# Patient Record
Sex: Male | Born: 1935 | Race: White | Hispanic: No | Marital: Married | Smoking: Never smoker
Health system: Southern US, Community
[De-identification: ages and names within clinical notes are randomized; demographics above are authoritative.]

## PROBLEM LIST (undated history)

## (undated) DIAGNOSIS — I8393 Asymptomatic varicose veins of bilateral lower extremities: Secondary | ICD-10-CM

## (undated) DIAGNOSIS — H409 Unspecified glaucoma: Secondary | ICD-10-CM

## (undated) DIAGNOSIS — K5900 Constipation, unspecified: Secondary | ICD-10-CM

## (undated) DIAGNOSIS — M79676 Pain in unspecified toe(s): Secondary | ICD-10-CM

## (undated) DIAGNOSIS — E785 Hyperlipidemia, unspecified: Secondary | ICD-10-CM

## (undated) DIAGNOSIS — M549 Dorsalgia, unspecified: Secondary | ICD-10-CM

## (undated) DIAGNOSIS — Z87442 Personal history of urinary calculi: Secondary | ICD-10-CM

## (undated) DIAGNOSIS — M109 Gout, unspecified: Secondary | ICD-10-CM

## (undated) DIAGNOSIS — Z8719 Personal history of other diseases of the digestive system: Secondary | ICD-10-CM

## (undated) DIAGNOSIS — R972 Elevated prostate specific antigen [PSA]: Secondary | ICD-10-CM

## (undated) DIAGNOSIS — M1990 Unspecified osteoarthritis, unspecified site: Secondary | ICD-10-CM

## (undated) DIAGNOSIS — H9190 Unspecified hearing loss, unspecified ear: Secondary | ICD-10-CM

## (undated) DIAGNOSIS — R109 Unspecified abdominal pain: Secondary | ICD-10-CM

## (undated) DIAGNOSIS — G5770 Causalgia of unspecified lower limb: Secondary | ICD-10-CM

## (undated) DIAGNOSIS — Z87898 Personal history of other specified conditions: Secondary | ICD-10-CM

## (undated) DIAGNOSIS — N3289 Other specified disorders of bladder: Secondary | ICD-10-CM

## (undated) DIAGNOSIS — J40 Bronchitis, not specified as acute or chronic: Secondary | ICD-10-CM

## (undated) DIAGNOSIS — N400 Enlarged prostate without lower urinary tract symptoms: Secondary | ICD-10-CM

## (undated) DIAGNOSIS — M2550 Pain in unspecified joint: Secondary | ICD-10-CM

## (undated) DIAGNOSIS — R001 Bradycardia, unspecified: Secondary | ICD-10-CM

## (undated) DIAGNOSIS — G2581 Restless legs syndrome: Secondary | ICD-10-CM

## (undated) DIAGNOSIS — R5383 Other fatigue: Secondary | ICD-10-CM

## (undated) DIAGNOSIS — R1310 Dysphagia, unspecified: Secondary | ICD-10-CM

## (undated) DIAGNOSIS — K862 Cyst of pancreas: Secondary | ICD-10-CM

## (undated) DIAGNOSIS — R269 Unspecified abnormalities of gait and mobility: Secondary | ICD-10-CM

## (undated) DIAGNOSIS — H3530 Unspecified macular degeneration: Secondary | ICD-10-CM

## (undated) DIAGNOSIS — K219 Gastro-esophageal reflux disease without esophagitis: Secondary | ICD-10-CM

## (undated) DIAGNOSIS — R079 Chest pain, unspecified: Secondary | ICD-10-CM

## (undated) DIAGNOSIS — I951 Orthostatic hypotension: Secondary | ICD-10-CM

## (undated) DIAGNOSIS — W57XXXA Bitten or stung by nonvenomous insect and other nonvenomous arthropods, initial encounter: Secondary | ICD-10-CM

## (undated) HISTORY — DX: Dorsalgia, unspecified: M54.9

## (undated) HISTORY — DX: Pain in unspecified joint: M25.50

## (undated) HISTORY — DX: Other fatigue: R53.83

## (undated) HISTORY — PX: CHOLECYSTECTOMY: SHX55

## (undated) HISTORY — DX: Cyst of pancreas: K86.2

## (undated) HISTORY — DX: Personal history of other diseases of the digestive system: Z87.19

## (undated) HISTORY — DX: Bradycardia, unspecified: R00.1

## (undated) HISTORY — DX: Restless legs syndrome: G25.81

## (undated) HISTORY — DX: Hyperlipidemia, unspecified: E78.5

## (undated) HISTORY — DX: Elevated prostate specific antigen (PSA): R97.20

## (undated) HISTORY — DX: Other specified disorders of bladder: N32.89

## (undated) HISTORY — DX: Pain in unspecified toe(s): M79.676

## (undated) HISTORY — DX: Benign prostatic hyperplasia without lower urinary tract symptoms: N40.0

## (undated) HISTORY — DX: Bitten or stung by nonvenomous insect and other nonvenomous arthropods, initial encounter: W57.XXXA

## (undated) HISTORY — DX: Gout, unspecified: M10.9

## (undated) HISTORY — DX: Dysphagia, unspecified: R13.10

## (undated) HISTORY — DX: Bronchitis, not specified as acute or chronic: J40

## (undated) HISTORY — DX: Unspecified macular degeneration: H35.30

## (undated) HISTORY — DX: Unspecified osteoarthritis, unspecified site: M19.90

## (undated) HISTORY — DX: Unspecified abnormalities of gait and mobility: R26.9

## (undated) HISTORY — DX: Unspecified glaucoma: H40.9

## (undated) HISTORY — DX: Asymptomatic varicose veins of bilateral lower extremities: I83.93

## (undated) HISTORY — DX: Orthostatic hypotension: I95.1

## (undated) HISTORY — DX: Causalgia of unspecified lower limb: G57.70

## (undated) HISTORY — DX: Unspecified abdominal pain: R10.9

## (undated) HISTORY — DX: Chest pain, unspecified: R07.9

## (undated) HISTORY — DX: Personal history of other specified conditions: Z87.898

## (undated) HISTORY — DX: Gastro-esophageal reflux disease without esophagitis: K21.9

## (undated) HISTORY — DX: Constipation, unspecified: K59.00

---

## 2001-10-13 ENCOUNTER — Other Ambulatory Visit: Admission: RE | Admit: 2001-10-13 | Discharge: 2001-10-13 | Payer: Self-pay | Admitting: General Surgery

## 2013-10-07 ENCOUNTER — Emergency Department (HOSPITAL_COMMUNITY)
Admission: EM | Admit: 2013-10-07 | Discharge: 2013-10-07 | Disposition: A | Discharge status: Home / Self Care | Payer: Medicare HMO | Attending: Emergency Medicine | Admitting: Emergency Medicine

## 2013-10-07 ENCOUNTER — Emergency Department (HOSPITAL_COMMUNITY): Payer: Medicare HMO

## 2013-10-07 ENCOUNTER — Encounter (HOSPITAL_COMMUNITY): Payer: Self-pay | Admitting: Emergency Medicine

## 2013-10-07 DIAGNOSIS — Z880 Allergy status to penicillin: Secondary | ICD-10-CM | POA: Insufficient documentation

## 2013-10-07 DIAGNOSIS — R531 Weakness: Secondary | ICD-10-CM

## 2013-10-07 DIAGNOSIS — B370 Candidal stomatitis: Secondary | ICD-10-CM | POA: Insufficient documentation

## 2013-10-07 DIAGNOSIS — R5383 Other fatigue: Secondary | ICD-10-CM

## 2013-10-07 DIAGNOSIS — Z87442 Personal history of urinary calculi: Secondary | ICD-10-CM | POA: Insufficient documentation

## 2013-10-07 DIAGNOSIS — R5381 Other malaise: Secondary | ICD-10-CM | POA: Insufficient documentation

## 2013-10-07 LAB — URINE MICROSCOPIC-ADD ON

## 2013-10-07 LAB — URINALYSIS, ROUTINE W REFLEX MICROSCOPIC
BILIRUBIN URINE: NEGATIVE
Glucose, UA: NEGATIVE mg/dL
Ketones, ur: NEGATIVE mg/dL
Leukocytes, UA: NEGATIVE
NITRITE: NEGATIVE
PH: 6.5 (ref 5.0–8.0)
Protein, ur: NEGATIVE mg/dL
SPECIFIC GRAVITY, URINE: 1.01 (ref 1.005–1.030)
Urobilinogen, UA: 0.2 mg/dL (ref 0.0–1.0)

## 2013-10-07 LAB — COMPREHENSIVE METABOLIC PANEL
ALT: 14 U/L (ref 0–53)
AST: 23 U/L (ref 0–37)
Albumin: 3.8 g/dL (ref 3.5–5.2)
Alkaline Phosphatase: 71 U/L (ref 39–117)
BUN: 15 mg/dL (ref 6–23)
CALCIUM: 9.2 mg/dL (ref 8.4–10.5)
CO2: 25 meq/L (ref 19–32)
CREATININE: 1.32 mg/dL (ref 0.50–1.35)
Chloride: 96 mEq/L (ref 96–112)
GFR calc non Af Amer: 46 mL/min — ABNORMAL LOW (ref >90)
GFR, EST AFRICAN AMERICAN: 53 mL/min — AB (ref >90)
GLUCOSE: 105 mg/dL — AB (ref 70–99)
Potassium: 4.2 mEq/L (ref 3.7–5.3)
Sodium: 134 mEq/L — ABNORMAL LOW (ref 137–147)
TOTAL PROTEIN: 8.1 g/dL (ref 6.0–8.3)
Total Bilirubin: 0.7 mg/dL (ref 0.3–1.2)

## 2013-10-07 LAB — CBC WITH DIFFERENTIAL/PLATELET
Basophils Absolute: 0 10*3/uL (ref 0.0–0.1)
Basophils Relative: 0 % (ref 0–1)
EOS ABS: 0.1 10*3/uL (ref 0.0–0.7)
EOS PCT: 1 % (ref 0–5)
HEMATOCRIT: 36.9 % — AB (ref 39.0–52.0)
HEMOGLOBIN: 12.1 g/dL — AB (ref 13.0–17.0)
LYMPHS ABS: 1.1 10*3/uL (ref 0.7–4.0)
LYMPHS PCT: 13 % (ref 12–46)
MCH: 28.6 pg (ref 26.0–34.0)
MCHC: 32.8 g/dL (ref 30.0–36.0)
MCV: 87.2 fL (ref 78.0–100.0)
MONO ABS: 0.8 10*3/uL (ref 0.1–1.0)
MONOS PCT: 10 % (ref 3–12)
Neutro Abs: 6.5 10*3/uL (ref 1.7–7.7)
Neutrophils Relative %: 76 % (ref 43–77)
Platelets: 176 10*3/uL (ref 150–400)
RBC: 4.23 MIL/uL (ref 4.22–5.81)
RDW: 15.3 % (ref 11.5–15.5)
WBC: 8.5 10*3/uL (ref 4.0–10.5)

## 2013-10-07 LAB — PRO B NATRIURETIC PEPTIDE: PRO B NATRI PEPTIDE: 239.2 pg/mL (ref 0–450)

## 2013-10-07 MED ORDER — NYSTATIN 100000 UNIT/ML MT SUSP
5.0000 mL | Freq: Once | OROMUCOSAL | Status: AC
  Administered 2013-10-07: 500000 [IU] via ORAL
  Filled 2013-10-07: qty 5

## 2013-10-07 MED ORDER — SODIUM CHLORIDE 0.9 % IV SOLN
1000.0000 mL | Freq: Once | INTRAVENOUS | Status: AC
  Administered 2013-10-07: 1000 mL via INTRAVENOUS

## 2013-10-07 MED ORDER — SODIUM CHLORIDE 0.9 % IV SOLN: 1000.0000 mL | INTRAVENOUS | Status: DC

## 2013-10-07 MED ORDER — NYSTATIN 100000 UNIT/ML MT SUSP: 5.0000 mL | Freq: Four times a day (QID) | OROMUCOSAL | Status: AC

## 2013-10-07 MED ORDER — LIDOCAINE VISCOUS 2 % MT SOLN
15.0000 mL | Freq: Once | OROMUCOSAL | Status: AC
  Administered 2013-10-07: 15 mL via OROMUCOSAL
  Filled 2013-10-07: qty 15

## 2013-10-07 NOTE — Discharge Instructions (Signed)
As discussed your exam has been largely reassuring.  There is evidence of thrush, or a yeast infection in the back of your throat.  It is important to stay well hydrated, get plenty of rest, and to have a repeat exam with a primary care physician in the next 3-5 days.  If you develop new, or concerning changes in your condition, please be sure to return here immediately.

## 2013-10-07 NOTE — ED Notes (Signed)
Patient from home complaining of weakness, cough, and fever starting today.

## 2013-10-07 NOTE — ED Provider Notes (Signed)
CSN: [id number]     Arrival date & time 10/07/13  2010 History  This chart was scribed for Carmin Muskrat, MD by Rolanda Lundborg, ED Scribe. This patient was seen in room APA09/APA09 and the patient's care was started at 8:20 PM.   Chief Complaint  Patient presents with  . Weakness  . Cough  . Fever   The history is provided by the patient. No language interpreter was used.   HPI Comments: Jonathon Butler is a 80 y.o. male who presents to the Emergency Department complaining of generalized weakness since yesterday with worsening cough and sore throat. He took Mucinex today with some relief. He reports lately he has been feeling dizzy after he gets out of the shower in the mornings. He denies CP, abdominal pain, nausea, vomiting.   Past Medical History  Diagnosis Date  . Kidney stones    Past Surgical History  Procedure Laterality Date  . Cholecystectomy     History reviewed. No pertinent family history. History  Substance Use Topics  . Smoking status: Never Smoker   . Smokeless tobacco: Not on file  . Alcohol Use: No    Review of Systems  Constitutional:       Per HPI, otherwise negative  HENT:       Per HPI, otherwise negative  Respiratory:       Per HPI, otherwise negative  Cardiovascular:       Per HPI, otherwise negative  Gastrointestinal: Negative for vomiting.  Endocrine:       Negative aside from HPI  Genitourinary:       Neg aside from HPI   Musculoskeletal:       Per HPI, otherwise negative  Skin: Negative.   Neurological: Negative for syncope.    Allergies  Penicillins  Home Medications  No current outpatient prescriptions on file. BP 165/71  Pulse 87  Temp(Src) 98.1 F (36.7 C) (Oral)  Resp 20  Ht 6\' 2"  (1.88 m)  Wt 202 lb (91.627 kg)  BMI 25.92 kg/m2  SpO2 95% Physical Exam  Nursing note and vitals reviewed. Constitutional: He is oriented to person, place, and time. He appears well-developed. No distress.  HENT:  Head: Normocephalic  and atraumatic.  Mucous membranes dry  Eyes: Conjunctivae and EOM are normal.  Cardiovascular: Normal rate and regular rhythm.   Pulmonary/Chest: Effort normal. No stridor. No respiratory distress.  Abdominal: He exhibits no distension.  Musculoskeletal: He exhibits no edema.  Neurological: He is alert and oriented to person, place, and time.  Skin: Skin is warm and dry.  Psychiatric: He has a normal mood and affect.    ED Course  Procedures (including critical care time) Medications  0.9 %  sodium chloride infusion (1,000 mLs Intravenous New Bag/Given 10/07/13 2115)    Followed by  0.9 %  sodium chloride infusion (not administered)  lidocaine (XYLOCAINE) 2 % viscous mouth solution 15 mL (15 mLs Mouth/Throat Given 10/07/13 2115)    COORDINATION OF CARE: 8:30 PM- Discussed treatment plan with pt. Pt agrees to plan.    Labs Review Labs Reviewed  CBC WITH DIFFERENTIAL - Abnormal; Notable for the following:    Hemoglobin 12.1 (*)    HCT 36.9 (*)    All other components within normal limits  URINALYSIS, ROUTINE W REFLEX MICROSCOPIC - Abnormal; Notable for the following:    Hgb urine dipstick TRACE (*)    All other components within normal limits  CULTURE, BLOOD (ROUTINE X 2)  CULTURE, BLOOD (ROUTINE X  2)  URINE MICROSCOPIC-ADD ON  COMPREHENSIVE METABOLIC PANEL  LACTIC ACID, PLASMA   Imaging Review Dg Chest Port 1 View  10/07/2013   CLINICAL DATA:  Weakness, cough, fever  EXAM: PORTABLE CHEST - 1 VIEW  COMPARISON:  None.  FINDINGS: Cardiomegaly with vascular congestion. No definite CHF or pneumonia. No collapse or consolidation. Negative for effusion or pneumothorax. Trachea midline. Atherosclerosis of the aorta. Degenerative changes of spine.  IMPRESSION: Cardiomegaly and vascular congestion   Electronically Signed   By: Daryll Brod M.D.   On: 10/07/2013 21:28    EKG Interpretation   None      On repeat exam, and after discussing all results with patient and his family  members, patient continues to have mild discomfort in the back of his throat.  Repeat visualization demonstrates a whitish plaque in the posterior oropharynx, with no asymmetry, no edema. MDM  This previously well elderly male with no known medical issues now presents with generalized discomfort, cough, subjective fever.  In addition the patient has oral pharyngeal discomfort.  Patient is in no respiratory distress, has stable vital signs, is afebrile, and his labs are largely reassuring.  There is some evidence of thrush for which the patient received topical medication.  Absent distress, warm and findings, he was discharged in stable condition to follow up with any primary care physician.   Carmin Muskrat, MD 10/07/13 2325

## 2013-10-08 LAB — LACTIC ACID, PLASMA: Lactic Acid, Venous: 1.8 mmol/L (ref 0.5–2.2)

## 2013-10-12 LAB — CULTURE, BLOOD (ROUTINE X 2)
Culture: NO GROWTH
Culture: NO GROWTH

## 2014-12-15 ENCOUNTER — Encounter (INDEPENDENT_AMBULATORY_CARE_PROVIDER_SITE_OTHER): Payer: Self-pay | Admitting: Ophthalmology

## 2015-01-10 ENCOUNTER — Encounter (INDEPENDENT_AMBULATORY_CARE_PROVIDER_SITE_OTHER): Payer: Self-pay | Admitting: Ophthalmology

## 2016-01-26 IMAGING — CR DG CHEST 1V PORT
1 series · 1 of 1 positions shown · non-contrast
Comparison: None.

CLINICAL DATA: Weakness, cough, fever

EXAM:
PORTABLE CHEST - 1 VIEW

[portable]
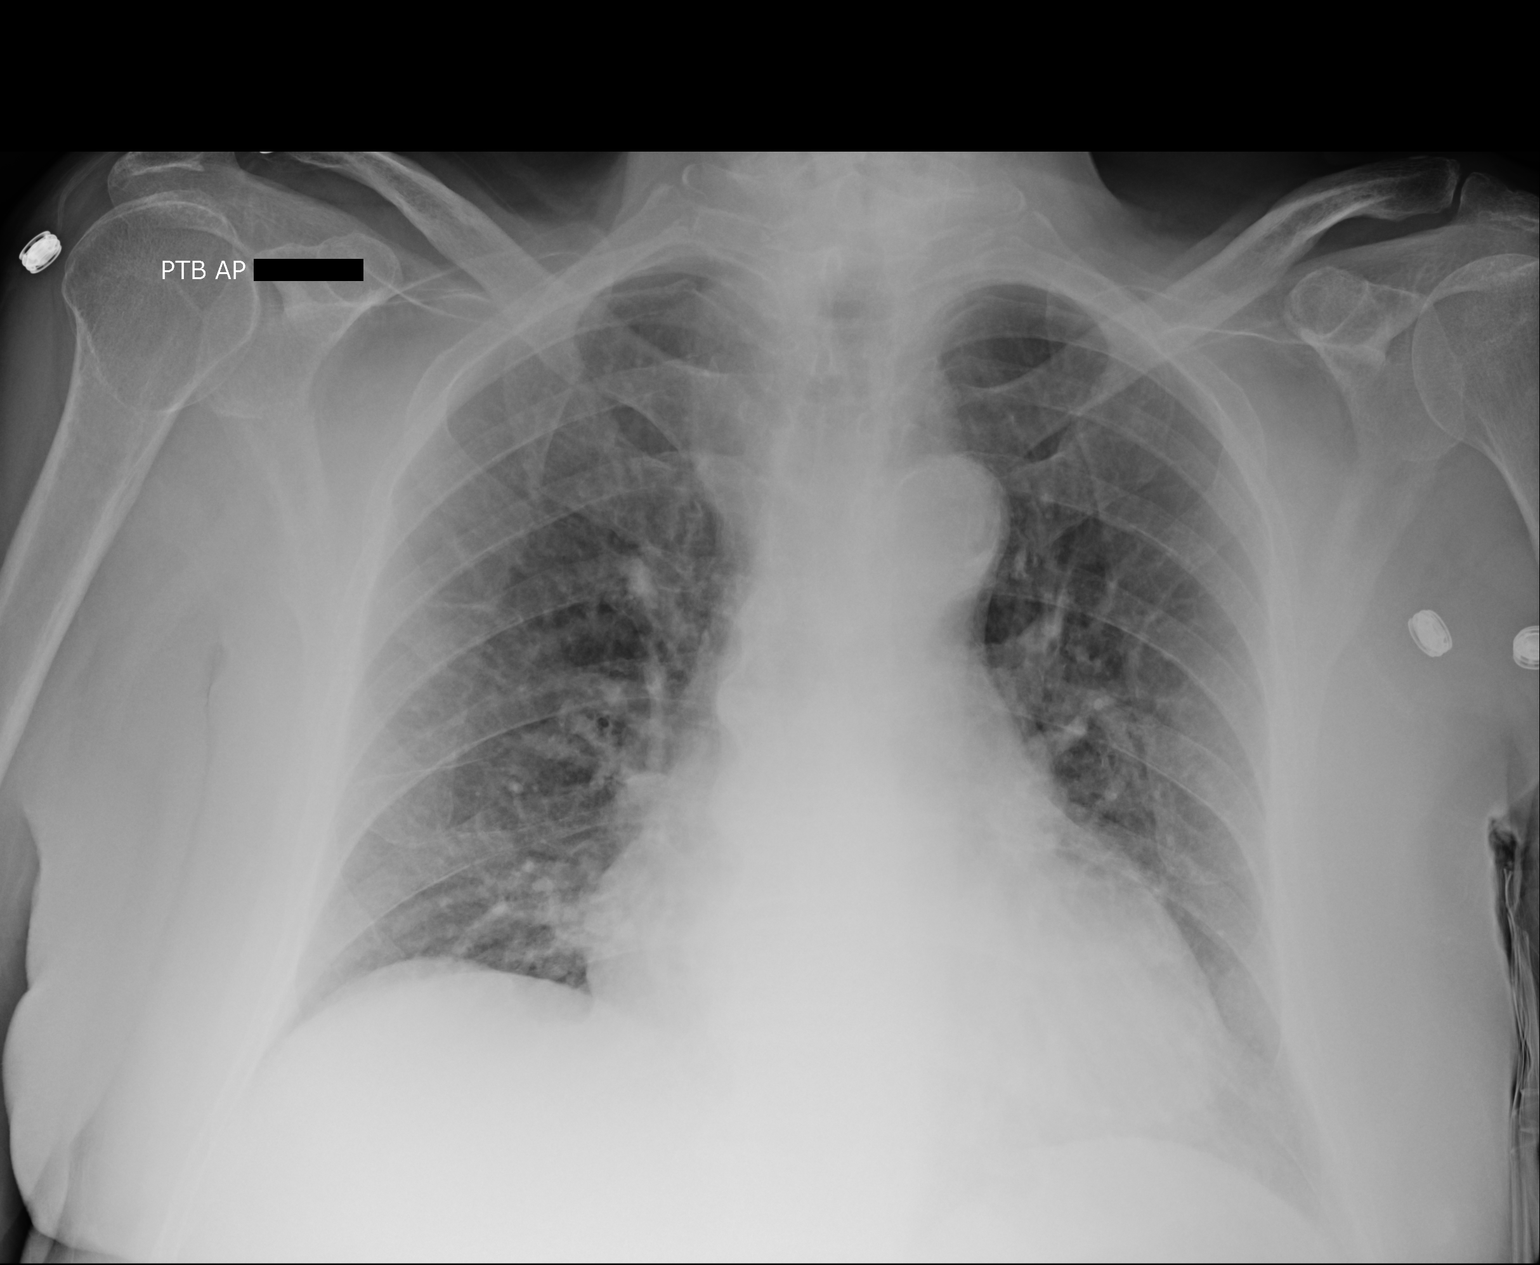

[1 of 1 positions shown; findings below may reference images not displayed]

FINDINGS: Cardiomegaly with vascular congestion. No definite CHF or pneumonia.
No collapse or consolidation. Negative for effusion or pneumothorax.
Trachea midline. Atherosclerosis of the aorta. Degenerative changes
of spine.
IMPRESSION: Cardiomegaly and vascular congestion

## 2016-10-25 DIAGNOSIS — H401133 Primary open-angle glaucoma, bilateral, severe stage: Secondary | ICD-10-CM | POA: Diagnosis not present

## 2016-11-11 DIAGNOSIS — Z859 Personal history of malignant neoplasm, unspecified: Secondary | ICD-10-CM | POA: Diagnosis not present

## 2016-11-11 DIAGNOSIS — K219 Gastro-esophageal reflux disease without esophagitis: Secondary | ICD-10-CM | POA: Diagnosis not present

## 2016-11-11 DIAGNOSIS — Z7982 Long term (current) use of aspirin: Secondary | ICD-10-CM | POA: Diagnosis not present

## 2016-11-11 DIAGNOSIS — Z87891 Personal history of nicotine dependence: Secondary | ICD-10-CM | POA: Diagnosis not present

## 2016-11-11 DIAGNOSIS — H409 Unspecified glaucoma: Secondary | ICD-10-CM | POA: Diagnosis not present

## 2016-11-11 DIAGNOSIS — N401 Enlarged prostate with lower urinary tract symptoms: Secondary | ICD-10-CM | POA: Diagnosis not present

## 2016-11-11 DIAGNOSIS — Z6828 Body mass index (BMI) 28.0-28.9, adult: Secondary | ICD-10-CM | POA: Diagnosis not present

## 2016-11-11 DIAGNOSIS — Z0000 Encounter for general adult medical examination without abnormal findings: Secondary | ICD-10-CM | POA: Diagnosis not present

## 2016-11-11 DIAGNOSIS — Z79899 Other long term (current) drug therapy: Secondary | ICD-10-CM | POA: Diagnosis not present

## 2016-12-04 DIAGNOSIS — H401122 Primary open-angle glaucoma, left eye, moderate stage: Secondary | ICD-10-CM | POA: Diagnosis not present

## 2016-12-04 DIAGNOSIS — H401113 Primary open-angle glaucoma, right eye, severe stage: Secondary | ICD-10-CM | POA: Diagnosis not present

## 2016-12-04 DIAGNOSIS — H353230 Exudative age-related macular degeneration, bilateral, stage unspecified: Secondary | ICD-10-CM | POA: Diagnosis not present

## 2016-12-24 ENCOUNTER — Encounter (INDEPENDENT_AMBULATORY_CARE_PROVIDER_SITE_OTHER): Payer: Medicare HMO | Admitting: Ophthalmology

## 2016-12-24 DIAGNOSIS — H43813 Vitreous degeneration, bilateral: Secondary | ICD-10-CM

## 2016-12-24 DIAGNOSIS — H353231 Exudative age-related macular degeneration, bilateral, with active choroidal neovascularization: Secondary | ICD-10-CM

## 2016-12-24 DIAGNOSIS — H26492 Other secondary cataract, left eye: Secondary | ICD-10-CM

## 2016-12-24 DIAGNOSIS — H47231 Glaucomatous optic atrophy, right eye: Secondary | ICD-10-CM

## 2017-01-06 DIAGNOSIS — N182 Chronic kidney disease, stage 2 (mild): Secondary | ICD-10-CM | POA: Diagnosis not present

## 2017-01-06 DIAGNOSIS — K219 Gastro-esophageal reflux disease without esophagitis: Secondary | ICD-10-CM | POA: Diagnosis not present

## 2017-01-06 DIAGNOSIS — Z713 Dietary counseling and surveillance: Secondary | ICD-10-CM | POA: Diagnosis not present

## 2017-01-06 DIAGNOSIS — N400 Enlarged prostate without lower urinary tract symptoms: Secondary | ICD-10-CM | POA: Diagnosis not present

## 2017-01-06 DIAGNOSIS — Z6829 Body mass index (BMI) 29.0-29.9, adult: Secondary | ICD-10-CM | POA: Diagnosis not present

## 2017-01-06 DIAGNOSIS — H3530 Unspecified macular degeneration: Secondary | ICD-10-CM | POA: Diagnosis not present

## 2017-01-06 DIAGNOSIS — Z299 Encounter for prophylactic measures, unspecified: Secondary | ICD-10-CM | POA: Diagnosis not present

## 2017-01-21 ENCOUNTER — Encounter (INDEPENDENT_AMBULATORY_CARE_PROVIDER_SITE_OTHER): Payer: Medicare HMO | Admitting: Ophthalmology

## 2017-01-21 DIAGNOSIS — H43813 Vitreous degeneration, bilateral: Secondary | ICD-10-CM

## 2017-01-21 DIAGNOSIS — H2703 Aphakia, bilateral: Secondary | ICD-10-CM

## 2017-01-21 DIAGNOSIS — H353231 Exudative age-related macular degeneration, bilateral, with active choroidal neovascularization: Secondary | ICD-10-CM

## 2017-01-21 DIAGNOSIS — H26492 Other secondary cataract, left eye: Secondary | ICD-10-CM

## 2017-02-19 ENCOUNTER — Encounter (INDEPENDENT_AMBULATORY_CARE_PROVIDER_SITE_OTHER): Payer: Medicare HMO | Admitting: Ophthalmology

## 2017-02-19 DIAGNOSIS — H353231 Exudative age-related macular degeneration, bilateral, with active choroidal neovascularization: Secondary | ICD-10-CM | POA: Diagnosis not present

## 2017-02-19 DIAGNOSIS — H43813 Vitreous degeneration, bilateral: Secondary | ICD-10-CM

## 2017-02-19 DIAGNOSIS — H47231 Glaucomatous optic atrophy, right eye: Secondary | ICD-10-CM | POA: Diagnosis not present

## 2017-03-07 DIAGNOSIS — K219 Gastro-esophageal reflux disease without esophagitis: Secondary | ICD-10-CM | POA: Diagnosis not present

## 2017-03-07 DIAGNOSIS — Z713 Dietary counseling and surveillance: Secondary | ICD-10-CM | POA: Diagnosis not present

## 2017-03-07 DIAGNOSIS — I8393 Asymptomatic varicose veins of bilateral lower extremities: Secondary | ICD-10-CM | POA: Diagnosis not present

## 2017-03-07 DIAGNOSIS — N400 Enlarged prostate without lower urinary tract symptoms: Secondary | ICD-10-CM | POA: Diagnosis not present

## 2017-03-07 DIAGNOSIS — R972 Elevated prostate specific antigen [PSA]: Secondary | ICD-10-CM | POA: Diagnosis not present

## 2017-03-07 DIAGNOSIS — Z6829 Body mass index (BMI) 29.0-29.9, adult: Secondary | ICD-10-CM | POA: Diagnosis not present

## 2017-03-07 DIAGNOSIS — Z299 Encounter for prophylactic measures, unspecified: Secondary | ICD-10-CM | POA: Diagnosis not present

## 2017-03-07 DIAGNOSIS — E7800 Pure hypercholesterolemia, unspecified: Secondary | ICD-10-CM | POA: Diagnosis not present

## 2017-03-19 ENCOUNTER — Encounter (INDEPENDENT_AMBULATORY_CARE_PROVIDER_SITE_OTHER): Payer: Medicare HMO | Admitting: Ophthalmology

## 2017-03-19 DIAGNOSIS — H43813 Vitreous degeneration, bilateral: Secondary | ICD-10-CM | POA: Diagnosis not present

## 2017-03-19 DIAGNOSIS — H353231 Exudative age-related macular degeneration, bilateral, with active choroidal neovascularization: Secondary | ICD-10-CM | POA: Diagnosis not present

## 2017-04-17 ENCOUNTER — Encounter (INDEPENDENT_AMBULATORY_CARE_PROVIDER_SITE_OTHER): Payer: Medicare HMO | Admitting: Ophthalmology

## 2017-04-17 DIAGNOSIS — H353231 Exudative age-related macular degeneration, bilateral, with active choroidal neovascularization: Secondary | ICD-10-CM

## 2017-04-17 DIAGNOSIS — H47231 Glaucomatous optic atrophy, right eye: Secondary | ICD-10-CM

## 2017-04-17 DIAGNOSIS — H43813 Vitreous degeneration, bilateral: Secondary | ICD-10-CM

## 2017-05-07 DIAGNOSIS — E7800 Pure hypercholesterolemia, unspecified: Secondary | ICD-10-CM | POA: Diagnosis not present

## 2017-05-07 DIAGNOSIS — Z299 Encounter for prophylactic measures, unspecified: Secondary | ICD-10-CM | POA: Diagnosis not present

## 2017-05-07 DIAGNOSIS — Z6829 Body mass index (BMI) 29.0-29.9, adult: Secondary | ICD-10-CM | POA: Diagnosis not present

## 2017-05-07 DIAGNOSIS — H353290 Exudative age-related macular degeneration, unspecified eye, stage unspecified: Secondary | ICD-10-CM | POA: Diagnosis not present

## 2017-05-07 DIAGNOSIS — Z7189 Other specified counseling: Secondary | ICD-10-CM | POA: Diagnosis not present

## 2017-05-07 DIAGNOSIS — H3530 Unspecified macular degeneration: Secondary | ICD-10-CM | POA: Diagnosis not present

## 2017-05-07 DIAGNOSIS — Z1389 Encounter for screening for other disorder: Secondary | ICD-10-CM | POA: Diagnosis not present

## 2017-05-07 DIAGNOSIS — R5383 Other fatigue: Secondary | ICD-10-CM | POA: Diagnosis not present

## 2017-05-07 DIAGNOSIS — Z0000 Encounter for general adult medical examination without abnormal findings: Secondary | ICD-10-CM | POA: Diagnosis not present

## 2017-05-07 DIAGNOSIS — M79675 Pain in left toe(s): Secondary | ICD-10-CM | POA: Diagnosis not present

## 2017-05-08 ENCOUNTER — Encounter: Payer: Self-pay | Admitting: Cardiovascular Disease

## 2017-05-08 DIAGNOSIS — R5383 Other fatigue: Secondary | ICD-10-CM | POA: Diagnosis not present

## 2017-05-08 DIAGNOSIS — E7800 Pure hypercholesterolemia, unspecified: Secondary | ICD-10-CM | POA: Diagnosis not present

## 2017-05-08 DIAGNOSIS — Z125 Encounter for screening for malignant neoplasm of prostate: Secondary | ICD-10-CM | POA: Diagnosis not present

## 2017-05-08 DIAGNOSIS — Z79899 Other long term (current) drug therapy: Secondary | ICD-10-CM | POA: Diagnosis not present

## 2017-05-08 DIAGNOSIS — Z0000 Encounter for general adult medical examination without abnormal findings: Secondary | ICD-10-CM | POA: Diagnosis not present

## 2017-05-14 DIAGNOSIS — H401113 Primary open-angle glaucoma, right eye, severe stage: Secondary | ICD-10-CM | POA: Diagnosis not present

## 2017-05-14 DIAGNOSIS — H401122 Primary open-angle glaucoma, left eye, moderate stage: Secondary | ICD-10-CM | POA: Diagnosis not present

## 2017-05-29 ENCOUNTER — Encounter (INDEPENDENT_AMBULATORY_CARE_PROVIDER_SITE_OTHER): Payer: Medicare HMO | Admitting: Ophthalmology

## 2017-05-29 DIAGNOSIS — H35033 Hypertensive retinopathy, bilateral: Secondary | ICD-10-CM

## 2017-05-29 DIAGNOSIS — H353231 Exudative age-related macular degeneration, bilateral, with active choroidal neovascularization: Secondary | ICD-10-CM | POA: Diagnosis not present

## 2017-05-29 DIAGNOSIS — I10 Essential (primary) hypertension: Secondary | ICD-10-CM | POA: Diagnosis not present

## 2017-05-29 DIAGNOSIS — H43813 Vitreous degeneration, bilateral: Secondary | ICD-10-CM | POA: Diagnosis not present

## 2017-06-06 DIAGNOSIS — Z299 Encounter for prophylactic measures, unspecified: Secondary | ICD-10-CM | POA: Diagnosis not present

## 2017-06-06 DIAGNOSIS — I8393 Asymptomatic varicose veins of bilateral lower extremities: Secondary | ICD-10-CM | POA: Diagnosis not present

## 2017-06-06 DIAGNOSIS — Z6828 Body mass index (BMI) 28.0-28.9, adult: Secondary | ICD-10-CM | POA: Diagnosis not present

## 2017-06-06 DIAGNOSIS — H353290 Exudative age-related macular degeneration, unspecified eye, stage unspecified: Secondary | ICD-10-CM | POA: Diagnosis not present

## 2017-06-06 DIAGNOSIS — N400 Enlarged prostate without lower urinary tract symptoms: Secondary | ICD-10-CM | POA: Diagnosis not present

## 2017-06-06 DIAGNOSIS — E7800 Pure hypercholesterolemia, unspecified: Secondary | ICD-10-CM | POA: Diagnosis not present

## 2017-06-06 DIAGNOSIS — R972 Elevated prostate specific antigen [PSA]: Secondary | ICD-10-CM | POA: Diagnosis not present

## 2017-06-10 DIAGNOSIS — K219 Gastro-esophageal reflux disease without esophagitis: Secondary | ICD-10-CM | POA: Diagnosis not present

## 2017-06-10 DIAGNOSIS — S91311A Laceration without foreign body, right foot, initial encounter: Secondary | ICD-10-CM | POA: Diagnosis not present

## 2017-06-10 DIAGNOSIS — R269 Unspecified abnormalities of gait and mobility: Secondary | ICD-10-CM | POA: Diagnosis not present

## 2017-06-10 DIAGNOSIS — Z299 Encounter for prophylactic measures, unspecified: Secondary | ICD-10-CM | POA: Diagnosis not present

## 2017-06-10 DIAGNOSIS — W25XXXA Contact with sharp glass, initial encounter: Secondary | ICD-10-CM | POA: Diagnosis not present

## 2017-06-10 DIAGNOSIS — Z6828 Body mass index (BMI) 28.0-28.9, adult: Secondary | ICD-10-CM | POA: Diagnosis not present

## 2017-06-10 DIAGNOSIS — E7800 Pure hypercholesterolemia, unspecified: Secondary | ICD-10-CM | POA: Diagnosis not present

## 2017-07-24 ENCOUNTER — Encounter (INDEPENDENT_AMBULATORY_CARE_PROVIDER_SITE_OTHER): Payer: Medicare HMO | Admitting: Ophthalmology

## 2017-08-04 ENCOUNTER — Encounter (INDEPENDENT_AMBULATORY_CARE_PROVIDER_SITE_OTHER): Payer: Medicare HMO | Admitting: Ophthalmology

## 2017-08-04 DIAGNOSIS — H353231 Exudative age-related macular degeneration, bilateral, with active choroidal neovascularization: Secondary | ICD-10-CM

## 2017-08-04 DIAGNOSIS — H43813 Vitreous degeneration, bilateral: Secondary | ICD-10-CM

## 2017-09-12 DIAGNOSIS — E7800 Pure hypercholesterolemia, unspecified: Secondary | ICD-10-CM | POA: Diagnosis not present

## 2017-09-12 DIAGNOSIS — Z789 Other specified health status: Secondary | ICD-10-CM | POA: Diagnosis not present

## 2017-09-12 DIAGNOSIS — Z6829 Body mass index (BMI) 29.0-29.9, adult: Secondary | ICD-10-CM | POA: Diagnosis not present

## 2017-09-12 DIAGNOSIS — H353290 Exudative age-related macular degeneration, unspecified eye, stage unspecified: Secondary | ICD-10-CM | POA: Diagnosis not present

## 2017-09-12 DIAGNOSIS — N400 Enlarged prostate without lower urinary tract symptoms: Secondary | ICD-10-CM | POA: Diagnosis not present

## 2017-09-12 DIAGNOSIS — Z299 Encounter for prophylactic measures, unspecified: Secondary | ICD-10-CM | POA: Diagnosis not present

## 2017-09-16 DIAGNOSIS — H16223 Keratoconjunctivitis sicca, not specified as Sjogren's, bilateral: Secondary | ICD-10-CM | POA: Diagnosis not present

## 2017-09-16 DIAGNOSIS — H401113 Primary open-angle glaucoma, right eye, severe stage: Secondary | ICD-10-CM | POA: Diagnosis not present

## 2017-09-16 DIAGNOSIS — H353230 Exudative age-related macular degeneration, bilateral, stage unspecified: Secondary | ICD-10-CM | POA: Diagnosis not present

## 2017-09-29 ENCOUNTER — Encounter (INDEPENDENT_AMBULATORY_CARE_PROVIDER_SITE_OTHER): Payer: Medicare HMO | Admitting: Ophthalmology

## 2017-09-29 DIAGNOSIS — H43813 Vitreous degeneration, bilateral: Secondary | ICD-10-CM | POA: Diagnosis not present

## 2017-09-29 DIAGNOSIS — H353232 Exudative age-related macular degeneration, bilateral, with inactive choroidal neovascularization: Secondary | ICD-10-CM | POA: Diagnosis not present

## 2017-10-28 ENCOUNTER — Ambulatory Visit: Payer: Medicare HMO | Admitting: Urology

## 2017-11-11 DIAGNOSIS — Z713 Dietary counseling and surveillance: Secondary | ICD-10-CM | POA: Diagnosis not present

## 2017-11-11 DIAGNOSIS — Z789 Other specified health status: Secondary | ICD-10-CM | POA: Diagnosis not present

## 2017-11-11 DIAGNOSIS — Z299 Encounter for prophylactic measures, unspecified: Secondary | ICD-10-CM | POA: Diagnosis not present

## 2017-11-11 DIAGNOSIS — Z6829 Body mass index (BMI) 29.0-29.9, adult: Secondary | ICD-10-CM | POA: Diagnosis not present

## 2017-11-11 DIAGNOSIS — M109 Gout, unspecified: Secondary | ICD-10-CM | POA: Diagnosis not present

## 2017-11-19 DIAGNOSIS — Z880 Allergy status to penicillin: Secondary | ICD-10-CM | POA: Diagnosis not present

## 2017-11-19 DIAGNOSIS — N400 Enlarged prostate without lower urinary tract symptoms: Secondary | ICD-10-CM | POA: Diagnosis not present

## 2017-11-19 DIAGNOSIS — Z7982 Long term (current) use of aspirin: Secondary | ICD-10-CM | POA: Diagnosis not present

## 2017-11-19 DIAGNOSIS — H409 Unspecified glaucoma: Secondary | ICD-10-CM | POA: Diagnosis not present

## 2017-11-19 DIAGNOSIS — K219 Gastro-esophageal reflux disease without esophagitis: Secondary | ICD-10-CM | POA: Diagnosis not present

## 2017-11-19 DIAGNOSIS — Z809 Family history of malignant neoplasm, unspecified: Secondary | ICD-10-CM | POA: Diagnosis not present

## 2017-11-19 DIAGNOSIS — Z833 Family history of diabetes mellitus: Secondary | ICD-10-CM | POA: Diagnosis not present

## 2017-11-24 ENCOUNTER — Encounter (INDEPENDENT_AMBULATORY_CARE_PROVIDER_SITE_OTHER): Payer: Medicare HMO | Admitting: Ophthalmology

## 2017-11-24 DIAGNOSIS — H43813 Vitreous degeneration, bilateral: Secondary | ICD-10-CM | POA: Diagnosis not present

## 2017-11-24 DIAGNOSIS — H353231 Exudative age-related macular degeneration, bilateral, with active choroidal neovascularization: Secondary | ICD-10-CM

## 2017-12-16 DIAGNOSIS — H401113 Primary open-angle glaucoma, right eye, severe stage: Secondary | ICD-10-CM | POA: Diagnosis not present

## 2017-12-16 DIAGNOSIS — H353230 Exudative age-related macular degeneration, bilateral, stage unspecified: Secondary | ICD-10-CM | POA: Diagnosis not present

## 2017-12-22 DIAGNOSIS — E7800 Pure hypercholesterolemia, unspecified: Secondary | ICD-10-CM | POA: Diagnosis not present

## 2017-12-22 DIAGNOSIS — Z299 Encounter for prophylactic measures, unspecified: Secondary | ICD-10-CM | POA: Diagnosis not present

## 2017-12-22 DIAGNOSIS — Z6828 Body mass index (BMI) 28.0-28.9, adult: Secondary | ICD-10-CM | POA: Diagnosis not present

## 2017-12-22 DIAGNOSIS — M25519 Pain in unspecified shoulder: Secondary | ICD-10-CM | POA: Diagnosis not present

## 2017-12-22 DIAGNOSIS — K219 Gastro-esophageal reflux disease without esophagitis: Secondary | ICD-10-CM | POA: Diagnosis not present

## 2018-01-09 ENCOUNTER — Encounter (INDEPENDENT_AMBULATORY_CARE_PROVIDER_SITE_OTHER): Payer: Medicare HMO | Admitting: Ophthalmology

## 2018-01-09 DIAGNOSIS — H43813 Vitreous degeneration, bilateral: Secondary | ICD-10-CM

## 2018-01-09 DIAGNOSIS — H353231 Exudative age-related macular degeneration, bilateral, with active choroidal neovascularization: Secondary | ICD-10-CM | POA: Diagnosis not present

## 2018-01-12 ENCOUNTER — Encounter (INDEPENDENT_AMBULATORY_CARE_PROVIDER_SITE_OTHER): Payer: Medicare HMO | Admitting: Ophthalmology

## 2018-01-13 DIAGNOSIS — Z299 Encounter for prophylactic measures, unspecified: Secondary | ICD-10-CM | POA: Diagnosis not present

## 2018-01-13 DIAGNOSIS — K219 Gastro-esophageal reflux disease without esophagitis: Secondary | ICD-10-CM | POA: Diagnosis not present

## 2018-01-13 DIAGNOSIS — Z6828 Body mass index (BMI) 28.0-28.9, adult: Secondary | ICD-10-CM | POA: Diagnosis not present

## 2018-01-13 DIAGNOSIS — Z713 Dietary counseling and surveillance: Secondary | ICD-10-CM | POA: Diagnosis not present

## 2018-01-20 DIAGNOSIS — Z880 Allergy status to penicillin: Secondary | ICD-10-CM | POA: Diagnosis not present

## 2018-01-20 DIAGNOSIS — J449 Chronic obstructive pulmonary disease, unspecified: Secondary | ICD-10-CM | POA: Diagnosis not present

## 2018-01-20 DIAGNOSIS — N400 Enlarged prostate without lower urinary tract symptoms: Secondary | ICD-10-CM | POA: Diagnosis not present

## 2018-01-20 DIAGNOSIS — R42 Dizziness and giddiness: Secondary | ICD-10-CM | POA: Diagnosis not present

## 2018-01-20 DIAGNOSIS — G459 Transient cerebral ischemic attack, unspecified: Secondary | ICD-10-CM | POA: Diagnosis not present

## 2018-01-20 DIAGNOSIS — R531 Weakness: Secondary | ICD-10-CM | POA: Diagnosis not present

## 2018-01-20 DIAGNOSIS — Z8673 Personal history of transient ischemic attack (TIA), and cerebral infarction without residual deficits: Secondary | ICD-10-CM | POA: Diagnosis not present

## 2018-01-20 DIAGNOSIS — R001 Bradycardia, unspecified: Secondary | ICD-10-CM | POA: Diagnosis not present

## 2018-01-20 DIAGNOSIS — I639 Cerebral infarction, unspecified: Secondary | ICD-10-CM | POA: Diagnosis not present

## 2018-01-20 DIAGNOSIS — Z79899 Other long term (current) drug therapy: Secondary | ICD-10-CM | POA: Diagnosis not present

## 2018-01-20 DIAGNOSIS — M6281 Muscle weakness (generalized): Secondary | ICD-10-CM | POA: Diagnosis not present

## 2018-01-21 DIAGNOSIS — R531 Weakness: Secondary | ICD-10-CM | POA: Diagnosis not present

## 2018-01-21 DIAGNOSIS — R001 Bradycardia, unspecified: Secondary | ICD-10-CM | POA: Diagnosis not present

## 2018-01-21 DIAGNOSIS — J449 Chronic obstructive pulmonary disease, unspecified: Secondary | ICD-10-CM | POA: Diagnosis not present

## 2018-01-21 DIAGNOSIS — G459 Transient cerebral ischemic attack, unspecified: Secondary | ICD-10-CM | POA: Diagnosis not present

## 2018-01-22 DIAGNOSIS — R001 Bradycardia, unspecified: Secondary | ICD-10-CM | POA: Diagnosis not present

## 2018-01-22 DIAGNOSIS — J449 Chronic obstructive pulmonary disease, unspecified: Secondary | ICD-10-CM | POA: Diagnosis not present

## 2018-01-22 DIAGNOSIS — G459 Transient cerebral ischemic attack, unspecified: Secondary | ICD-10-CM | POA: Diagnosis not present

## 2018-01-22 DIAGNOSIS — R531 Weakness: Secondary | ICD-10-CM | POA: Diagnosis not present

## 2018-01-22 DIAGNOSIS — M6281 Muscle weakness (generalized): Secondary | ICD-10-CM | POA: Diagnosis not present

## 2018-01-23 DIAGNOSIS — E7800 Pure hypercholesterolemia, unspecified: Secondary | ICD-10-CM | POA: Diagnosis not present

## 2018-01-23 DIAGNOSIS — I10 Essential (primary) hypertension: Secondary | ICD-10-CM | POA: Diagnosis not present

## 2018-01-23 DIAGNOSIS — I69354 Hemiplegia and hemiparesis following cerebral infarction affecting left non-dominant side: Secondary | ICD-10-CM | POA: Diagnosis not present

## 2018-01-23 DIAGNOSIS — J441 Chronic obstructive pulmonary disease with (acute) exacerbation: Secondary | ICD-10-CM | POA: Diagnosis not present

## 2018-01-23 DIAGNOSIS — N400 Enlarged prostate without lower urinary tract symptoms: Secondary | ICD-10-CM | POA: Diagnosis not present

## 2018-01-23 DIAGNOSIS — M1991 Primary osteoarthritis, unspecified site: Secondary | ICD-10-CM | POA: Diagnosis not present

## 2018-01-23 DIAGNOSIS — Z7982 Long term (current) use of aspirin: Secondary | ICD-10-CM | POA: Diagnosis not present

## 2018-01-23 DIAGNOSIS — J209 Acute bronchitis, unspecified: Secondary | ICD-10-CM | POA: Diagnosis not present

## 2018-01-23 DIAGNOSIS — R001 Bradycardia, unspecified: Secondary | ICD-10-CM | POA: Diagnosis not present

## 2018-01-24 DIAGNOSIS — I10 Essential (primary) hypertension: Secondary | ICD-10-CM | POA: Diagnosis not present

## 2018-01-24 DIAGNOSIS — M1991 Primary osteoarthritis, unspecified site: Secondary | ICD-10-CM | POA: Diagnosis not present

## 2018-01-24 DIAGNOSIS — J441 Chronic obstructive pulmonary disease with (acute) exacerbation: Secondary | ICD-10-CM | POA: Diagnosis not present

## 2018-01-24 DIAGNOSIS — E7800 Pure hypercholesterolemia, unspecified: Secondary | ICD-10-CM | POA: Diagnosis not present

## 2018-01-24 DIAGNOSIS — I69354 Hemiplegia and hemiparesis following cerebral infarction affecting left non-dominant side: Secondary | ICD-10-CM | POA: Diagnosis not present

## 2018-01-24 DIAGNOSIS — J209 Acute bronchitis, unspecified: Secondary | ICD-10-CM | POA: Diagnosis not present

## 2018-01-24 DIAGNOSIS — N400 Enlarged prostate without lower urinary tract symptoms: Secondary | ICD-10-CM | POA: Diagnosis not present

## 2018-01-24 DIAGNOSIS — Z7982 Long term (current) use of aspirin: Secondary | ICD-10-CM | POA: Diagnosis not present

## 2018-01-26 DIAGNOSIS — M1991 Primary osteoarthritis, unspecified site: Secondary | ICD-10-CM | POA: Diagnosis not present

## 2018-01-26 DIAGNOSIS — Z7982 Long term (current) use of aspirin: Secondary | ICD-10-CM | POA: Diagnosis not present

## 2018-01-26 DIAGNOSIS — N400 Enlarged prostate without lower urinary tract symptoms: Secondary | ICD-10-CM | POA: Diagnosis not present

## 2018-01-26 DIAGNOSIS — E7800 Pure hypercholesterolemia, unspecified: Secondary | ICD-10-CM | POA: Diagnosis not present

## 2018-01-26 DIAGNOSIS — J441 Chronic obstructive pulmonary disease with (acute) exacerbation: Secondary | ICD-10-CM | POA: Diagnosis not present

## 2018-01-26 DIAGNOSIS — I10 Essential (primary) hypertension: Secondary | ICD-10-CM | POA: Diagnosis not present

## 2018-01-26 DIAGNOSIS — I69354 Hemiplegia and hemiparesis following cerebral infarction affecting left non-dominant side: Secondary | ICD-10-CM | POA: Diagnosis not present

## 2018-01-26 DIAGNOSIS — J209 Acute bronchitis, unspecified: Secondary | ICD-10-CM | POA: Diagnosis not present

## 2018-01-28 DIAGNOSIS — K219 Gastro-esophageal reflux disease without esophagitis: Secondary | ICD-10-CM | POA: Diagnosis not present

## 2018-01-28 DIAGNOSIS — Z713 Dietary counseling and surveillance: Secondary | ICD-10-CM | POA: Diagnosis not present

## 2018-01-28 DIAGNOSIS — Z299 Encounter for prophylactic measures, unspecified: Secondary | ICD-10-CM | POA: Diagnosis not present

## 2018-01-28 DIAGNOSIS — Z6828 Body mass index (BMI) 28.0-28.9, adult: Secondary | ICD-10-CM | POA: Diagnosis not present

## 2018-01-28 DIAGNOSIS — R001 Bradycardia, unspecified: Secondary | ICD-10-CM | POA: Diagnosis not present

## 2018-02-03 DIAGNOSIS — M1991 Primary osteoarthritis, unspecified site: Secondary | ICD-10-CM | POA: Diagnosis not present

## 2018-02-03 DIAGNOSIS — J209 Acute bronchitis, unspecified: Secondary | ICD-10-CM | POA: Diagnosis not present

## 2018-02-03 DIAGNOSIS — N400 Enlarged prostate without lower urinary tract symptoms: Secondary | ICD-10-CM | POA: Diagnosis not present

## 2018-02-03 DIAGNOSIS — I69354 Hemiplegia and hemiparesis following cerebral infarction affecting left non-dominant side: Secondary | ICD-10-CM | POA: Diagnosis not present

## 2018-02-03 DIAGNOSIS — Z7982 Long term (current) use of aspirin: Secondary | ICD-10-CM | POA: Diagnosis not present

## 2018-02-03 DIAGNOSIS — E7800 Pure hypercholesterolemia, unspecified: Secondary | ICD-10-CM | POA: Diagnosis not present

## 2018-02-03 DIAGNOSIS — I10 Essential (primary) hypertension: Secondary | ICD-10-CM | POA: Diagnosis not present

## 2018-02-03 DIAGNOSIS — J441 Chronic obstructive pulmonary disease with (acute) exacerbation: Secondary | ICD-10-CM | POA: Diagnosis not present

## 2018-02-06 ENCOUNTER — Encounter: Payer: Self-pay | Admitting: *Deleted

## 2018-02-06 DIAGNOSIS — Z299 Encounter for prophylactic measures, unspecified: Secondary | ICD-10-CM | POA: Diagnosis not present

## 2018-02-06 DIAGNOSIS — Z713 Dietary counseling and surveillance: Secondary | ICD-10-CM | POA: Diagnosis not present

## 2018-02-06 DIAGNOSIS — Z6828 Body mass index (BMI) 28.0-28.9, adult: Secondary | ICD-10-CM | POA: Diagnosis not present

## 2018-02-06 DIAGNOSIS — K219 Gastro-esophageal reflux disease without esophagitis: Secondary | ICD-10-CM | POA: Diagnosis not present

## 2018-02-09 ENCOUNTER — Ambulatory Visit: Payer: Medicare HMO | Admitting: Cardiovascular Disease

## 2018-02-09 ENCOUNTER — Encounter: Payer: Self-pay | Admitting: *Deleted

## 2018-02-09 ENCOUNTER — Encounter: Payer: Self-pay | Admitting: Cardiovascular Disease

## 2018-02-09 VITALS — BP 124/70 | HR 68 | Ht 72.0 in | Wt 195.0 lb

## 2018-02-09 DIAGNOSIS — I6523 Occlusion and stenosis of bilateral carotid arteries: Secondary | ICD-10-CM | POA: Diagnosis not present

## 2018-02-09 DIAGNOSIS — I471 Supraventricular tachycardia: Secondary | ICD-10-CM | POA: Diagnosis not present

## 2018-02-09 DIAGNOSIS — Z9289 Personal history of other medical treatment: Secondary | ICD-10-CM | POA: Diagnosis not present

## 2018-02-09 DIAGNOSIS — R42 Dizziness and giddiness: Secondary | ICD-10-CM

## 2018-02-09 DIAGNOSIS — R001 Bradycardia, unspecified: Secondary | ICD-10-CM | POA: Diagnosis not present

## 2018-02-09 DIAGNOSIS — R55 Syncope and collapse: Secondary | ICD-10-CM

## 2018-02-09 NOTE — Patient Instructions (Addendum)
Medication Instructions:  Continue all current medications.  Labwork: none  Testing/Procedures: none  Follow-Up: 2 months   Any Other Special Instructions Will Be Listed Below (If Applicable).  If you need a refill on your cardiac medications before your next appointment, please call your pharmacy.  

## 2018-02-09 NOTE — Progress Notes (Addendum)
CARDIOLOGY CONSULT NOTE  Patient ID: Jonathon Butler. MRN: [id number] DOB/AGE: 82-Mar-1922 82 y.o.  Admit date: (Not on file) Primary Physician: Monico Blitz, MD Referring Physician: Monico Blitz, MD  Reason for Consultation: Bradycardia  HPI: Jonathon Butler. is a 84 y.o. male who is being seen today for the evaluation of bradycardia at the request of Monico Blitz, MD.   I reviewed records from his PCP.  I also reviewed extensive hospitalization records from May 2019 at [facility].  He has apparently had 2 episodes of near syncope.  Each episode occurred about 2 days apart.  There was some question as to whether or not he had a TIA as he had some left arm and leg numbness.  Head CT showed no acute changes.  MRI demonstrated chronic small vessel ischemic changes but no acute abnormalities.  I reviewed an echocardiogram performed on 01/21/2018 which demonstrated normal left ventricular systolic function, LVEF greater than 55%, mild LVH, and mild aortic and mitral regurgitation.  He apparently wore a 2-week cardiac event monitor which they just mailed back in.  I reviewed to ECGs performed on 01/20/2018.  One ECG demonstrated sinus bradycardia, 50 bpm, right bundle branch block, left posterior fascicular block, and anteroseptal Q waves.  Another ECG which I reviewed dated 01/20/2018 showed sinus rhythm, 72 bpm, right bundle branch block, and left anterior fascicular block.  I am told that his heart rate periodically dipped into the 30-40 bpm range.  He now complains of dizziness.  He denies loss of consciousness.  He denies chest pain and shortness of breath.  I do not know if the TSH was checked but I do not see any in the hospitalization records.    Allergies  Allergen Reactions  . Penicillins     Current Outpatient Medications  Medication Sig Dispense Refill  . aspirin EC 325 MG tablet Take 325 mg by mouth daily.    Marland Kitchen omeprazole (PRILOSEC) 40 MG  capsule Take 40 mg by mouth daily.    . tamsulosin (FLOMAX) 0.4 MG CAPS capsule Take 0.4 mg by mouth.     No current facility-administered medications for this visit.     Past Medical History:  Diagnosis Date  . Abdominal pain   . Backache   . Bladder ulcer   . BPH (benign prostatic hyperplasia)   . Bradycardia   . Bronchitis   . Causalgia of lower limb   . Chest pain   . Constipation   . Dysphagia   . Elevated PSA   . Fatigue   . Gait difficulty   . GERD (gastroesophageal reflux disease)   . Glaucoma   . Gout   . Hx of gastroenteritis   . Hx of headache   . Hyperlipidemia   . Joint pain   . Kidney stone   . Kidney stones   . Macular degeneration   . Orthostatic hypotension   . Osteoarthritis   . Pancreatic cyst   . Restless leg   . Tick bite   . Toe pain   . Varicose veins of both lower extremities     Past Surgical History:  Procedure Laterality Date  . CHOLECYSTECTOMY      Social History   Socioeconomic History  . Marital status: Married    Spouse name: Not on file  . Number of children: Not on file  . Years of education: Not on file  . Highest education level: Not on file  Occupational History  . Not on file  Social Needs  . Financial resource strain: Not on file  . Food insecurity:    Worry: Not on file    Inability: Not on file  . Transportation needs:    Medical: Not on file    Non-medical: Not on file  Tobacco Use  . Smoking status: Never Smoker  . Smokeless tobacco: Former Network engineer and Sexual Activity  . Alcohol use: No  . Drug use: No  . Sexual activity: Not on file  Lifestyle  . Physical activity:    Days per week: Not on file    Minutes per session: Not on file  . Stress: Not on file  Relationships  . Social connections:    Talks on phone: Not on file    Gets together: Not on file    Attends religious service: Not on file    Active member of club or organization: Not on file    Attends meetings of clubs or  organizations: Not on file    Relationship status: Not on file  . Intimate partner violence:    Fear of current or ex partner: Not on file    Emotionally abused: Not on file    Physically abused: Not on file    Forced sexual activity: Not on file  Other Topics Concern  . Not on file  Social History Narrative  . Not on file     No family history of premature CAD in 1st degree relatives.  Current Meds  Medication Sig  . aspirin EC 325 MG tablet Take 325 mg by mouth daily.  Marland Kitchen omeprazole (PRILOSEC) 40 MG capsule Take 40 mg by mouth daily.  . tamsulosin (FLOMAX) 0.4 MG CAPS capsule Take 0.4 mg by mouth.      Review of systems complete and found to be negative unless listed above in HPI    Physical exam Blood pressure 124/70, pulse 68, height 6' (1.829 m), weight 195 lb (88.5 kg), SpO2 98 %. General: NAD, elderly, frail Neck: No JVD, no thyromegaly or thyroid nodule.  Lungs: Clear to auscultation bilaterally with normal respiratory effort. CV: Nondisplaced PMI. Regular rate and rhythm, normal S1/S2, no S3/S4, no murmur.  No peripheral edema.  No carotid bruit.   Abdomen: Soft, nontender, no distention.  Skin: Intact without lesions or rashes.  Neurologic: Alert and oriented x 3.  Psych: Normal affect. Extremities: No clubbing or cyanosis.  HEENT: Normal.   ECG: Most recent ECG reviewed.   Labs: Lab Results  Component Value Date/Time   K 4.2 10/07/2013 09:32 PM   BUN 15 10/07/2013 09:32 PM   CREATININE 1.32 10/07/2013 09:32 PM   ALT 14 10/07/2013 09:32 PM   HGB 12.1 (L) 10/07/2013 09:32 PM     Lipids: No results found for: LDLCALC, LDLDIRECT, CHOL, TRIG, HDL      ASSESSMENT AND PLAN:   1.  Bradycardia and near syncope: He certainly has conduction system disease as demonstrated by ECGs reviewed above.  He recently wore a cardiac event monitor ordered by his PCP and just mailed it back in.  I will request these results once made available and reviewed them myself.   I will also check to see if the TSH was checked.  If not, I will order one.  I talked about the possibility of needing a pacemaker if he indeed has symptomatic bradycardia.  I also talked to him about the possibility of an implantable loop recorder.  Left ventricular  systolic function and regional wall motion appear to be normal.  I would consider a Lexiscan Myoview in the future but will await event monitoring results.    Disposition: Follow up in 2 months  Signed: Kate Sable, M.D., F.A.C.C.  02/09/2018, 11:01 AM  Addendum:  I reviewed his cardiac monitor.  Average heart rate was 59 bpm with a heart rate of 34 bpm at 5:19 AM.  There was also paroxysmal supraventricular tachycardia, 158 bpm.  PACs and PVCs with short atrial runs were also seen.  I did not see any sinus pauses. I may consider referral to EP for an implantable loop recorder.

## 2018-02-11 DIAGNOSIS — J441 Chronic obstructive pulmonary disease with (acute) exacerbation: Secondary | ICD-10-CM | POA: Diagnosis not present

## 2018-02-11 DIAGNOSIS — E7800 Pure hypercholesterolemia, unspecified: Secondary | ICD-10-CM | POA: Diagnosis not present

## 2018-02-11 DIAGNOSIS — Z7982 Long term (current) use of aspirin: Secondary | ICD-10-CM | POA: Diagnosis not present

## 2018-02-11 DIAGNOSIS — J209 Acute bronchitis, unspecified: Secondary | ICD-10-CM | POA: Diagnosis not present

## 2018-02-11 DIAGNOSIS — N400 Enlarged prostate without lower urinary tract symptoms: Secondary | ICD-10-CM | POA: Diagnosis not present

## 2018-02-11 DIAGNOSIS — M1991 Primary osteoarthritis, unspecified site: Secondary | ICD-10-CM | POA: Diagnosis not present

## 2018-02-11 DIAGNOSIS — I10 Essential (primary) hypertension: Secondary | ICD-10-CM | POA: Diagnosis not present

## 2018-02-11 DIAGNOSIS — I69354 Hemiplegia and hemiparesis following cerebral infarction affecting left non-dominant side: Secondary | ICD-10-CM | POA: Diagnosis not present

## 2018-02-12 DIAGNOSIS — R001 Bradycardia, unspecified: Secondary | ICD-10-CM | POA: Diagnosis not present

## 2018-02-13 ENCOUNTER — Telehealth: Payer: Self-pay

## 2018-02-13 NOTE — Addendum Note (Signed)
Addended by: Julian Hy T on: 02/13/2018 02:02 PM   Modules accepted: Orders

## 2018-02-13 NOTE — Progress Notes (Signed)
EP referral placed and forwarded to schedulers

## 2018-02-13 NOTE — Telephone Encounter (Signed)
Patients wife contacted office stating that Dr. Janyth Pupa office contacting them with monitor results and that patient needed an appointment to see Dr. Bronson Ing. Advised patient we would request the monitor results, have Dr. Bronson Ing review them and let him make a decision on what to do. Wife verbalized understanding

## 2018-02-18 DIAGNOSIS — M1991 Primary osteoarthritis, unspecified site: Secondary | ICD-10-CM | POA: Diagnosis not present

## 2018-02-18 DIAGNOSIS — I10 Essential (primary) hypertension: Secondary | ICD-10-CM | POA: Diagnosis not present

## 2018-02-18 DIAGNOSIS — Z7982 Long term (current) use of aspirin: Secondary | ICD-10-CM | POA: Diagnosis not present

## 2018-02-18 DIAGNOSIS — J209 Acute bronchitis, unspecified: Secondary | ICD-10-CM | POA: Diagnosis not present

## 2018-02-18 DIAGNOSIS — I69354 Hemiplegia and hemiparesis following cerebral infarction affecting left non-dominant side: Secondary | ICD-10-CM | POA: Diagnosis not present

## 2018-02-18 DIAGNOSIS — N400 Enlarged prostate without lower urinary tract symptoms: Secondary | ICD-10-CM | POA: Diagnosis not present

## 2018-02-18 DIAGNOSIS — E7800 Pure hypercholesterolemia, unspecified: Secondary | ICD-10-CM | POA: Diagnosis not present

## 2018-02-18 DIAGNOSIS — J441 Chronic obstructive pulmonary disease with (acute) exacerbation: Secondary | ICD-10-CM | POA: Diagnosis not present

## 2018-02-19 ENCOUNTER — Encounter: Payer: Self-pay | Admitting: *Deleted

## 2018-02-24 DIAGNOSIS — M1991 Primary osteoarthritis, unspecified site: Secondary | ICD-10-CM | POA: Diagnosis not present

## 2018-02-24 DIAGNOSIS — I10 Essential (primary) hypertension: Secondary | ICD-10-CM | POA: Diagnosis not present

## 2018-02-24 DIAGNOSIS — N400 Enlarged prostate without lower urinary tract symptoms: Secondary | ICD-10-CM | POA: Diagnosis not present

## 2018-02-24 DIAGNOSIS — Z7982 Long term (current) use of aspirin: Secondary | ICD-10-CM | POA: Diagnosis not present

## 2018-02-24 DIAGNOSIS — J441 Chronic obstructive pulmonary disease with (acute) exacerbation: Secondary | ICD-10-CM | POA: Diagnosis not present

## 2018-02-24 DIAGNOSIS — J209 Acute bronchitis, unspecified: Secondary | ICD-10-CM | POA: Diagnosis not present

## 2018-02-24 DIAGNOSIS — E7800 Pure hypercholesterolemia, unspecified: Secondary | ICD-10-CM | POA: Diagnosis not present

## 2018-02-24 DIAGNOSIS — I69354 Hemiplegia and hemiparesis following cerebral infarction affecting left non-dominant side: Secondary | ICD-10-CM | POA: Diagnosis not present

## 2018-02-25 DIAGNOSIS — Z299 Encounter for prophylactic measures, unspecified: Secondary | ICD-10-CM | POA: Diagnosis not present

## 2018-02-25 DIAGNOSIS — R001 Bradycardia, unspecified: Secondary | ICD-10-CM | POA: Diagnosis not present

## 2018-02-25 DIAGNOSIS — Z6828 Body mass index (BMI) 28.0-28.9, adult: Secondary | ICD-10-CM | POA: Diagnosis not present

## 2018-02-25 DIAGNOSIS — H353290 Exudative age-related macular degeneration, unspecified eye, stage unspecified: Secondary | ICD-10-CM | POA: Diagnosis not present

## 2018-02-25 DIAGNOSIS — I8393 Asymptomatic varicose veins of bilateral lower extremities: Secondary | ICD-10-CM | POA: Diagnosis not present

## 2018-02-26 ENCOUNTER — Encounter (INDEPENDENT_AMBULATORY_CARE_PROVIDER_SITE_OTHER): Payer: Medicare HMO | Admitting: Ophthalmology

## 2018-03-09 ENCOUNTER — Ambulatory Visit: Payer: Medicare HMO | Admitting: Internal Medicine

## 2018-03-09 ENCOUNTER — Encounter: Payer: Self-pay | Admitting: Internal Medicine

## 2018-03-09 VITALS — BP 112/70 | HR 52 | Ht 72.0 in | Wt 196.0 lb

## 2018-03-09 DIAGNOSIS — I453 Trifascicular block: Secondary | ICD-10-CM | POA: Diagnosis not present

## 2018-03-09 DIAGNOSIS — R55 Syncope and collapse: Secondary | ICD-10-CM | POA: Diagnosis not present

## 2018-03-09 DIAGNOSIS — R001 Bradycardia, unspecified: Secondary | ICD-10-CM

## 2018-03-09 DIAGNOSIS — Z01812 Encounter for preprocedural laboratory examination: Secondary | ICD-10-CM | POA: Diagnosis not present

## 2018-03-09 MED ORDER — ASPIRIN EC 81 MG PO TBEC: 81.0000 mg | DELAYED_RELEASE_TABLET | Freq: Every day | ORAL | 3 refills | Status: AC

## 2018-03-09 NOTE — Patient Instructions (Addendum)
Medication Instructions:  Your physician has recommended you make the following change in your medication:  1. DECREASE Aspirin to 81 mg once daily     * If you need a refill on your cardiac medications before your next appointment, please call your pharmacy. *   Labwork: Today:  BMET & CBC * Will notify you of abnormal results, otherwise continue current treatment plan.*   Testing/Procedures: Your physician has recommended that you have a pacemaker inserted. A pacemaker is a small device that is placed under the skin of your chest or abdomen to help control abnormal heart rhythms. This device uses electrical pulses to prompt the heart to beat at a normal rate. Pacemakers are used to treat heart rhythms that are too slow. Wire (leads) are attached to the pacemaker that goes into the chambers of you heart. This is done in the hospital and usually requires and overnight stay. Please follow the instructions below, located under the special instructions section.   Follow-Up: Your physician recommends that you schedule a wound check appointment 10-14 days, after your procedure on  04/07/2018, with the device clinic.  Your physician recommends that you schedule a follow up appointment in 91 days, after your procedure on  04/07/2018, with Dr. Rayann Heman.  * Please note that any paperwork needing to be filled out by the provider will need to be addressed at the front desk prior to seeing the provider.  Please note that any FMLA, disability or other documents regarding health condition is subject to a $25.00 charge that must be received prior to completion of paperwork in the form of a money order or check. *  Thank you for choosing [facility]!!     Any Other Special Instructions Will Be Listed Below (If Applicable).   Implantable Device Instructions  You are scheduled for:                  _____ Implantable Permanent Pacemaker  on  04/07/2018  with Dr. Rayann Heman.  1.    Please arrive at the [facility], Entrance "A"  at [facility] at  1:30 p.m. on the day of your procedure. (The address is [address]  2. Do not eat or drink after midnight the night before your procedure.  3.   Complete pre procedure  lab work on 03/09/2018.  The lab at [facility] is open from 8:00 AM to 4:30 PM.  You do not have to be fasting.  4.  All of your medications may be taken with a small amount of water the morning of your procedure.  5.  Plan for an overnight stay.  Bring your insurance cards and a list of you medications.  6.  Wash your chest and neck with surgical scrub the evening before and the morning of  your procedure.  Rinse well. Please review the surgical scrub instruction sheet given to you.  7. Your chest will need to be shaved prior to this procedure (if needed). We ask that you do this yourself at home 1 to 2 days before or if uncomfortable/unable to do yourself, then it will be performed by the hospital staff the day of.                                                                                                                *  If you have ANY questions after you get home, please call Myrtie Hawk, RN @ [phone].  * Every attempt is made to prevent procedures from being rescheduled.  Due to the nature of  Electrophysiology, rescheduling can happen.  The physician is always aware and directs the staff when this occurs.   Pacemaker Implantation, Adult Pacemaker implantation is a procedure to place a pacemaker inside your chest. A pacemaker is a small computer that sends electrical signals to the heart and helps your heart beat normally. A pacemaker also stores information about your heart rhythms. You may need pacemaker implantation if you:  Have a slow heartbeat (bradycardia).  Faint (syncope).  Have shortness of breath (dyspnea) due to heart problems.  The pacemaker attaches to your heart through a wire,  called a lead. Sometimes just one lead is needed. Other times, there will be two leads. There are two types of pacemakers:  Transvenous pacemaker. This type is placed under the skin or muscle of your chest. The lead goes through a vein in the chest area to reach the inside of the heart.  Epicardial pacemaker. This type is placed under the skin or muscle of your chest or belly. The lead goes through your chest to the outside of the heart.  Tell a health care provider about:  Any allergies you have.  All medicines you are taking, including vitamins, herbs, eye drops, creams, and over-the-counter medicines.  Any problems you or family members have had with anesthetic medicines.  Any blood or bone disorders you have.  Any surgeries you have had.  Any medical conditions you have.  Whether you are pregnant or may be pregnant. What are the risks? Generally, this is a safe procedure. However, problems may occur, including:  Infection.  Bleeding.  Failure of the pacemaker or the lead.  Collapse of a lung or bleeding into a lung.  Blood clot inside a blood vessel with a lead.  Damage to the heart.  Infection inside the heart (endocarditis).  Allergic reactions to medicines.  What happens before the procedure? Staying hydrated Follow instructions from your health care provider about hydration, which may include:  Up to 2 hours before the procedure - you may continue to drink clear liquids, such as water, clear fruit juice, black coffee, and plain tea.  Eating and drinking restrictions Follow instructions from your health care provider about eating and drinking, which may include:  8 hours before the procedure - stop eating heavy meals or foods such as meat, fried foods, or fatty foods.  6 hours before the procedure - stop eating light meals or foods, such as toast or cereal.  6 hours before the procedure - stop drinking milk or drinks that contain milk.  2 hours before the  procedure - stop drinking clear liquids.  Medicines  Ask your health care provider about: ? Changing or stopping your regular medicines. This is especially important if you are taking diabetes medicines or blood thinners. ? Taking medicines such as aspirin and ibuprofen. These medicines can thin your blood. Do not take these medicines before your procedure if your health care provider instructs you not to.  You may be given antibiotic medicine to help prevent infection. General instructions  You will have a heart evaluation. This may include an electrocardiogram (ECG), chest X-ray, and heart imaging (echocardiogram,  or echo) tests.  You will have blood tests.  Do not use any products that contain nicotine or tobacco, such as cigarettes and e-cigarettes. If  you need help quitting, ask your health care provider.  Plan to have someone take you home from the hospital or clinic.  If you will be going home right after the procedure, plan to have someone with you for 24 hours.  Ask your health care provider how your surgical site will be marked or identified. What happens during the procedure?  To reduce your risk of infection: ? Your health care team will wash or sanitize their hands. ? Your skin will be washed with soap. ? Hair may be removed from the surgical area.  An IV tube will be inserted into one of your veins.  You will be given one or more of the following: ? A medicine to help you relax (sedative). ? A medicine to numb the area (local anesthetic). ? A medicine to make you fall asleep (general anesthetic).  If you are getting a transvenous pacemaker: ? An incision will be made in your upper chest. ? A pocket will be made for the pacemaker. It may be placed under the skin or between layers of muscle. ? The lead will be inserted into a blood vessel that returns to the heart. ? While X-rays are taken by an imaging machine (fluoroscopy), the lead will be advanced through the  vein to the inside of your heart. ? The other end of the lead will be tunneled under the skin and attached to the pacemaker.  If you are getting an epicardial pacemaker: ? An incision will be made near your ribs or breastbone (sternum) for the lead. ? The lead will be attached to the outside of your heart. ? Another incision will be made in your chest or upper belly to create a pocket for the pacemaker. ? The free end of the lead will be tunneled under the skin and attached to the pacemaker.  The transvenous or epicardial pacemaker will be tested. Imaging studies may be done to check the lead position.  The incisions will be closed with stitches (sutures), adhesive strips, or skin glue.  Bandages (dressing) will be placed over the incisions. The procedure may vary among health care providers and hospitals. What happens after the procedure?  Your blood pressure, heart rate, breathing rate, and blood oxygen level will be monitored until the medicines you were given have worn off.  You will be given antibiotics and pain medicine.  ECG and chest x-rays will be done.  You will wear a continuous type of ECG (Holter monitor) to check your heart rhythm.  Your health care provider willprogram the pacemaker.  Do not drive for 24 hours if you received a sedative. This information is not intended to replace advice given to you by your health care provider. Make sure you discuss any questions you have with your health care provider. Document Released: 08/16/2002 Document Revised: 03/15/2016 Document Reviewed: 02/07/2016 Elsevier Interactive Patient Education  [employer].

## 2018-03-09 NOTE — Progress Notes (Signed)
Electrophysiology Office Note   Date:  03/09/2018   ID:  [name], DOB 05/28/1921, MRN [id number]  PCP:  Monico Blitz, MD  Cardiologist:  Dr Bronson Ing Primary Electrophysiologist: Thompson Grayer, MD    CC: bradycardia   History of Present Illness: Jonathon Butler. is a 84 y.o. male who presents today for electrophysiology evaluation.   He is referred by Dr Bronson Ing for EP consultation regarding bradycardia. He has been recently hospitalzed at [facility] with presyncope 5/19.  He had associated arm/leg numbness and there was concern for TIA as a possible cause.  Echo revealed normal EF.  He is also felt to have trifascicular block.  + intermittent dizziness with abrupt presyncope lasting several seconds.  He worn an event monitor (not available for me to review).  Per Dr Court Joy note 02/09/18, this revealed sinus with average heart rate 59 bpm, nocturnal bradycardia noted.  PACs, PVCs, and paroyxsmal SVT at 158 bpm.  No sinus pauses or AV block was observed.  He is referred for additional evaluation.  Today, he denies symptoms of palpitations, chest pain, shortness of breath, orthopnea, PND, lower extremity edema, claudication, syncope, bleeding, or neurologic sequela. The patient is tolerating medications without difficulties and is otherwise without complaint today.    Past Medical History:  Diagnosis Date  . Abdominal pain   . Backache   . Bladder ulcer   . BPH (benign prostatic hyperplasia)   . Bradycardia   . Bronchitis   . Causalgia of lower limb   . Chest pain   . Constipation   . Dysphagia   . Elevated PSA   . Fatigue   . Gait difficulty   . GERD (gastroesophageal reflux disease)   . Glaucoma   . Gout   . Hx of gastroenteritis   . Hx of headache   . Hyperlipidemia   . Joint pain   . Kidney stone   . Kidney stones   . Macular degeneration   . Orthostatic hypotension   . Osteoarthritis   . Pancreatic cyst   . Restless leg   . Tick  bite   . Toe pain   . Varicose veins of both lower extremities    Past Surgical History:  Procedure Laterality Date  . CHOLECYSTECTOMY       Current Outpatient Medications  Medication Sig Dispense Refill  . aspirin EC 325 MG tablet Take 325 mg by mouth daily.    . dorzolamide (TRUSOPT) 2 % ophthalmic solution Place 1 drop into both eyes 2 (two) times daily.   6  . latanoprost (XALATAN) 0.005 % ophthalmic solution Place 1 drop into both eyes at bedtime.  5  . omeprazole (PRILOSEC) 40 MG capsule Take 40 mg by mouth daily.    . tamsulosin (FLOMAX) 0.4 MG CAPS capsule Take 0.4 mg by mouth.     No current facility-administered medications for this visit.     Allergies:   Penicillins   Social History:  The patient  reports that he has never smoked. He has quit using smokeless tobacco. He reports that he does not drink alcohol or use drugs.   Family History:  + HTN   ROS:  Please see the history of present illness.   All other systems are personally reviewed and negative.    PHYSICAL EXAM: VS:  BP 112/70   Pulse (!) 52   Ht 6' (1.829 m)   Wt 196 lb (88.9 kg)   BMI 26.58 kg/m  ,  BMI Body mass index is 26.58 kg/m. GEN: elderly, in no acute distress  HEENT: normal  Neck: no JVD, carotid bruits, or masses Cardiac: RRR; no murmurs, rubs, or gallops,no edema  Respiratory:  clear to auscultation bilaterally, normal work of breathing GI: soft, nontender, nondistended, + BS MS: no deformity or atrophy  Skin: warm and dry  Neuro:  Strength and sensation are intact Psych: euthymic mood, full affect  EKG:  EKG is ordered today. The ekg ordered today is personally reviewed and shows sinus rhythm 52 bpm, PR 156 msec, QRS 152 msec, Qtc 459 msec, RBBB, LAHB   Recent Labs: No results found for requested labs within last 8760 hours.  personally reviewed   Lipid Panel  No results found for: CHOL, TRIG, HDL, CHOLHDL, VLDL, LDLCALC, LDLDIRECT personally reviewed   Wt Readings from  Last 3 Encounters:  03/09/18 196 lb (88.9 kg)  02/09/18 195 lb (88.5 kg)  01/13/18 198 lb (89.8 kg)      Other studies personally reviewed: Additional studies/ records that were reviewed today include: Dr Court Joy notes, echo 01/21/18  Review of the above records today demonstrates: EF 55%, mild AS, mild MR   ASSESSMENT AND PLAN:  1.  Trifascicular block and presyncope The patient has symptomatic bradycardia.   I worry that his dizziness/ presyncope is due to bradycardia.  He has documented bradycardia and also trifascicular block.  I would therefore recommend pacemaker implantation at this time.  Risks, benefits, alternatives to pacemaker implantation were discussed in detail with the patient today. The patient understands that the risks include but are not limited to bleeding, infection, pneumothorax, perforation, tamponade, vascular damage, renal failure, MI, stroke, death,  and lead dislodgement and wishes to proceed. We will therefore schedule the procedure at the next available time.   Reduce ASA to 81mg  daily in the interim.    Current medicines are reviewed at length with the patient today.   The patient does not have concerns regarding his medicines.  The following changes were made today:  none    Signed, Thompson Grayer, MD  03/09/2018 11:52 AM     [facility] [address] [place] [address] [phone] (office) [phone] (fax)

## 2018-03-09 NOTE — H&P (View-Only) (Signed)
Electrophysiology Office Note   Date:  03/09/2018   ID:  [name], DOB [birth year], MRN [id number]  PCP:  Monico Blitz, MD  Cardiologist:  Dr Bronson Ing Primary Electrophysiologist: Thompson Grayer, MD    CC: bradycardia   History of Present Illness: Jonathon Butler. is a 84 y.o. male who presents today for electrophysiology evaluation.   He is referred by Dr Bronson Ing for EP consultation regarding bradycardia. He has been recently hospitalzed at [facility] with presyncope 5/19.  He had associated arm/leg numbness and there was concern for TIA as a possible cause.  Echo revealed normal EF.  He is also felt to have trifascicular block.  + intermittent dizziness with abrupt presyncope lasting several seconds.  He worn an event monitor (not available for me to review).  Per Dr Court Joy note 02/09/18, this revealed sinus with average heart rate 59 bpm, nocturnal bradycardia noted.  PACs, PVCs, and paroyxsmal SVT at 158 bpm.  No sinus pauses or AV block was observed.  He is referred for additional evaluation.  Today, he denies symptoms of palpitations, chest pain, shortness of breath, orthopnea, PND, lower extremity edema, claudication, syncope, bleeding, or neurologic sequela. The patient is tolerating medications without difficulties and is otherwise without complaint today.    Past Medical History:  Diagnosis Date  . Abdominal pain   . Backache   . Bladder ulcer   . BPH (benign prostatic hyperplasia)   . Bradycardia   . Bronchitis   . Causalgia of lower limb   . Chest pain   . Constipation   . Dysphagia   . Elevated PSA   . Fatigue   . Gait difficulty   . GERD (gastroesophageal reflux disease)   . Glaucoma   . Gout   . Hx of gastroenteritis   . Hx of headache   . Hyperlipidemia   . Joint pain   . Kidney stone   . Kidney stones   . Macular degeneration   . Orthostatic hypotension   . Osteoarthritis   . Pancreatic cyst   . Restless leg   . Tick  bite   . Toe pain   . Varicose veins of both lower extremities    Past Surgical History:  Procedure Laterality Date  . CHOLECYSTECTOMY       Current Outpatient Medications  Medication Sig Dispense Refill  . aspirin EC 325 MG tablet Take 325 mg by mouth daily.    . dorzolamide (TRUSOPT) 2 % ophthalmic solution Place 1 drop into both eyes 2 (two) times daily.   6  . latanoprost (XALATAN) 0.005 % ophthalmic solution Place 1 drop into both eyes at bedtime.  5  . omeprazole (PRILOSEC) 40 MG capsule Take 40 mg by mouth daily.    . tamsulosin (FLOMAX) 0.4 MG CAPS capsule Take 0.4 mg by mouth.     No current facility-administered medications for this visit.     Allergies:   Penicillins   Social History:  The patient  reports that he has never smoked. He has quit using smokeless tobacco. He reports that he does not drink alcohol or use drugs.   Family History:  + HTN   ROS:  Please see the history of present illness.   All other systems are personally reviewed and negative.    PHYSICAL EXAM: VS:  BP 112/70   Pulse (!) 52   Ht 6' (1.829 m)   Wt 196 lb (88.9 kg)   BMI 26.58 kg/m  ,  BMI Body mass index is 26.58 kg/m. GEN: elderly, in no acute distress  HEENT: normal  Neck: no JVD, carotid bruits, or masses Cardiac: RRR; no murmurs, rubs, or gallops,no edema  Respiratory:  clear to auscultation bilaterally, normal work of breathing GI: soft, nontender, nondistended, + BS MS: no deformity or atrophy  Skin: warm and dry  Neuro:  Strength and sensation are intact Psych: euthymic mood, full affect  EKG:  EKG is ordered today. The ekg ordered today is personally reviewed and shows sinus rhythm 52 bpm, PR 156 msec, QRS 152 msec, Qtc 459 msec, RBBB, LAHB   Recent Labs: No results found for requested labs within last 8760 hours.  personally reviewed   Lipid Panel  No results found for: CHOL, TRIG, HDL, CHOLHDL, VLDL, LDLCALC, LDLDIRECT personally reviewed   Wt Readings from  Last 3 Encounters:  03/09/18 196 lb (88.9 kg)  02/09/18 195 lb (88.5 kg)  01/13/18 198 lb (89.8 kg)      Other studies personally reviewed: Additional studies/ records that were reviewed today include: Dr Court Joy notes, echo 01/21/18  Review of the above records today demonstrates: EF 55%, mild AS, mild MR   ASSESSMENT AND PLAN:  1.  Trifascicular block and presyncope The patient has symptomatic bradycardia.   I worry that his dizziness/ presyncope is due to bradycardia.  He has documented bradycardia and also trifascicular block.  I would therefore recommend pacemaker implantation at this time.  Risks, benefits, alternatives to pacemaker implantation were discussed in detail with the patient today. The patient understands that the risks include but are not limited to bleeding, infection, pneumothorax, perforation, tamponade, vascular damage, renal failure, MI, stroke, death,  and lead dislodgement and wishes to proceed. We will therefore schedule the procedure at the next available time.   Reduce ASA to 81mg  daily in the interim.    Current medicines are reviewed at length with the patient today.   The patient does not have concerns regarding his medicines.  The following changes were made today:  none    Signed, Thompson Grayer, MD  03/09/2018 11:52 AM     [facility] [address] [place]  [place] [address] [phone] (office) [phone] (fax)

## 2018-03-10 LAB — BASIC METABOLIC PANEL
BUN/Creatinine Ratio: 10 (ref 10–24)
BUN: 13 mg/dL (ref 10–36)
CALCIUM: 9.6 mg/dL (ref 8.6–10.2)
CHLORIDE: 106 mmol/L (ref 96–106)
CO2: 20 mmol/L (ref 20–29)
CREATININE: 1.32 mg/dL — AB (ref 0.76–1.27)
GFR calc non Af Amer: 46 mL/min/{1.73_m2} — ABNORMAL LOW (ref >59)
GFR, EST AFRICAN AMERICAN: 53 mL/min/{1.73_m2} — AB (ref >59)
GLUCOSE: 86 mg/dL (ref 65–99)
Potassium: 4.6 mmol/L (ref 3.5–5.2)
Sodium: 139 mmol/L (ref 134–144)

## 2018-03-10 LAB — CBC
Hematocrit: 39.3 % (ref 37.5–51.0)
Hemoglobin: 12.2 g/dL — ABNORMAL LOW (ref 13.0–17.7)
MCH: 28.3 pg (ref 26.6–33.0)
MCHC: 31 g/dL — ABNORMAL LOW (ref 31.5–35.7)
MCV: 91 fL (ref 79–97)
Platelets: 244 10*3/uL (ref 150–450)
RBC: 4.31 x10E6/uL (ref 4.14–5.80)
RDW: 17 % — ABNORMAL HIGH (ref 12.3–15.4)
WBC: 6.1 10*3/uL (ref 3.4–10.8)

## 2018-03-11 ENCOUNTER — Encounter (INDEPENDENT_AMBULATORY_CARE_PROVIDER_SITE_OTHER): Payer: Medicare HMO | Admitting: Ophthalmology

## 2018-03-11 DIAGNOSIS — H43813 Vitreous degeneration, bilateral: Secondary | ICD-10-CM | POA: Diagnosis not present

## 2018-03-11 DIAGNOSIS — H353231 Exudative age-related macular degeneration, bilateral, with active choroidal neovascularization: Secondary | ICD-10-CM | POA: Diagnosis not present

## 2018-04-03 ENCOUNTER — Telehealth: Payer: Self-pay | Admitting: Internal Medicine

## 2018-04-03 NOTE — Telephone Encounter (Signed)
Talked with patient and confirmed that the patient is to continue all medications in the morning with a small sip of water. Reviewed the instructions with the patient and they expressed understanding.

## 2018-04-03 NOTE — Telephone Encounter (Signed)
New Message         Patient caregiver called to see if the patient was suppose to stop his bare Asprin before his procedure on 07/30. Pls advise.

## 2018-04-07 ENCOUNTER — Ambulatory Visit (HOSPITAL_COMMUNITY)
Admission: RE | Disposition: A | Discharge status: Home / Self Care | Payer: Self-pay | Source: Ambulatory Visit | Attending: Internal Medicine

## 2018-04-07 ENCOUNTER — Ambulatory Visit (HOSPITAL_COMMUNITY)
Admission: RE | Admit: 2018-04-07 | Discharge: 2018-04-08 | Disposition: A | Discharge status: Home / Self Care | Payer: Medicare HMO | Source: Ambulatory Visit | Attending: Internal Medicine | Admitting: Internal Medicine

## 2018-04-07 ENCOUNTER — Encounter (HOSPITAL_COMMUNITY): Payer: Self-pay | Admitting: General Practice

## 2018-04-07 ENCOUNTER — Other Ambulatory Visit: Payer: Self-pay

## 2018-04-07 DIAGNOSIS — M109 Gout, unspecified: Secondary | ICD-10-CM | POA: Insufficient documentation

## 2018-04-07 DIAGNOSIS — E785 Hyperlipidemia, unspecified: Secondary | ICD-10-CM | POA: Insufficient documentation

## 2018-04-07 DIAGNOSIS — Z87442 Personal history of urinary calculi: Secondary | ICD-10-CM | POA: Insufficient documentation

## 2018-04-07 DIAGNOSIS — I471 Supraventricular tachycardia: Secondary | ICD-10-CM | POA: Diagnosis not present

## 2018-04-07 DIAGNOSIS — Z9049 Acquired absence of other specified parts of digestive tract: Secondary | ICD-10-CM | POA: Diagnosis not present

## 2018-04-07 DIAGNOSIS — H3530 Unspecified macular degeneration: Secondary | ICD-10-CM | POA: Diagnosis not present

## 2018-04-07 DIAGNOSIS — H409 Unspecified glaucoma: Secondary | ICD-10-CM | POA: Diagnosis not present

## 2018-04-07 DIAGNOSIS — R001 Bradycardia, unspecified: Secondary | ICD-10-CM | POA: Insufficient documentation

## 2018-04-07 DIAGNOSIS — Z880 Allergy status to penicillin: Secondary | ICD-10-CM | POA: Diagnosis not present

## 2018-04-07 DIAGNOSIS — N400 Enlarged prostate without lower urinary tract symptoms: Secondary | ICD-10-CM | POA: Insufficient documentation

## 2018-04-07 DIAGNOSIS — K219 Gastro-esophageal reflux disease without esophagitis: Secondary | ICD-10-CM | POA: Diagnosis not present

## 2018-04-07 DIAGNOSIS — Z8249 Family history of ischemic heart disease and other diseases of the circulatory system: Secondary | ICD-10-CM | POA: Insufficient documentation

## 2018-04-07 DIAGNOSIS — I453 Trifascicular block: Secondary | ICD-10-CM | POA: Diagnosis not present

## 2018-04-07 DIAGNOSIS — G2581 Restless legs syndrome: Secondary | ICD-10-CM | POA: Insufficient documentation

## 2018-04-07 DIAGNOSIS — Z959 Presence of cardiac and vascular implant and graft, unspecified: Secondary | ICD-10-CM

## 2018-04-07 DIAGNOSIS — Z87891 Personal history of nicotine dependence: Secondary | ICD-10-CM | POA: Diagnosis not present

## 2018-04-07 DIAGNOSIS — I495 Sick sinus syndrome: Secondary | ICD-10-CM | POA: Diagnosis present

## 2018-04-07 DIAGNOSIS — Z79899 Other long term (current) drug therapy: Secondary | ICD-10-CM | POA: Diagnosis not present

## 2018-04-07 DIAGNOSIS — M1990 Unspecified osteoarthritis, unspecified site: Secondary | ICD-10-CM | POA: Diagnosis not present

## 2018-04-07 DIAGNOSIS — R55 Syncope and collapse: Secondary | ICD-10-CM | POA: Diagnosis not present

## 2018-04-07 DIAGNOSIS — Z7982 Long term (current) use of aspirin: Secondary | ICD-10-CM | POA: Diagnosis not present

## 2018-04-07 HISTORY — PX: PACEMAKER IMPLANT: EP1218

## 2018-04-07 HISTORY — DX: Unspecified hearing loss, unspecified ear: H91.90

## 2018-04-07 HISTORY — DX: Personal history of urinary calculi: Z87.442

## 2018-04-07 LAB — SURGICAL PCR SCREEN
MRSA, PCR: NEGATIVE
STAPHYLOCOCCUS AUREUS: NEGATIVE

## 2018-04-07 SURGERY — PACEMAKER IMPLANT

## 2018-04-07 MED ORDER — MUPIROCIN 2 % EX OINT
TOPICAL_OINTMENT | CUTANEOUS | Status: AC
  Administered 2018-04-07: 14:00:00
  Filled 2018-04-07: qty 22

## 2018-04-07 MED ORDER — HYDROCODONE-ACETAMINOPHEN 5-325 MG PO TABS
1.0000 | ORAL_TABLET | ORAL | Status: DC | PRN
  Administered 2018-04-07: 2 via ORAL
  Administered 2018-04-08: 1 via ORAL
  Filled 2018-04-07 (×3): qty 1

## 2018-04-07 MED ORDER — HEPARIN (PORCINE) IN NACL 1000-0.9 UT/500ML-% IV SOLN
INTRAVENOUS | Status: DC | PRN
  Administered 2018-04-07: 500 mL

## 2018-04-07 MED ORDER — VANCOMYCIN HCL IN DEXTROSE 1-5 GM/200ML-% IV SOLN
1000.0000 mg | INTRAVENOUS | Status: AC
  Administered 2018-04-07: 1000 mg via INTRAVENOUS

## 2018-04-07 MED ORDER — HEPARIN (PORCINE) IN NACL 1000-0.9 UT/500ML-% IV SOLN
INTRAVENOUS | Status: AC
  Filled 2018-04-07: qty 500

## 2018-04-07 MED ORDER — SODIUM CHLORIDE 0.9% FLUSH
3.0000 mL | Freq: Two times a day (BID) | INTRAVENOUS | Status: DC
  Administered 2018-04-07: 3 mL via INTRAVENOUS

## 2018-04-07 MED ORDER — IOPAMIDOL (ISOVUE-370) INJECTION 76%
INTRAVENOUS | Status: DC | PRN
  Administered 2018-04-07: 15 mL via INTRA_ARTERIAL

## 2018-04-07 MED ORDER — DORZOLAMIDE HCL 2 % OP SOLN
1.0000 [drp] | Freq: Two times a day (BID) | OPHTHALMIC | Status: DC
  Administered 2018-04-07 – 2018-04-08 (×2): 1 [drp] via OPHTHALMIC
  Filled 2018-04-07: qty 10

## 2018-04-07 MED ORDER — SODIUM CHLORIDE 0.9 % IV SOLN
INTRAVENOUS | Status: DC
  Administered 2018-04-07: 14:00:00 via INTRAVENOUS

## 2018-04-07 MED ORDER — VANCOMYCIN HCL IN DEXTROSE 1-5 GM/200ML-% IV SOLN
1000.0000 mg | Freq: Two times a day (BID) | INTRAVENOUS | Status: AC
  Administered 2018-04-08: 1000 mg via INTRAVENOUS
  Filled 2018-04-07: qty 200

## 2018-04-07 MED ORDER — SODIUM CHLORIDE 0.9 % IV SOLN: 250.0000 mL | INTRAVENOUS | Status: DC | PRN

## 2018-04-07 MED ORDER — TAMSULOSIN HCL 0.4 MG PO CAPS
0.4000 mg | ORAL_CAPSULE | Freq: Every day | ORAL | Status: DC
  Administered 2018-04-07 – 2018-04-08 (×2): 0.4 mg via ORAL
  Filled 2018-04-07 (×2): qty 1

## 2018-04-07 MED ORDER — SODIUM CHLORIDE 0.9 % IV SOLN: 80.0000 mg | INTRAVENOUS | Status: DC

## 2018-04-07 MED ORDER — VANCOMYCIN HCL IN DEXTROSE 1-5 GM/200ML-% IV SOLN
INTRAVENOUS | Status: AC
  Filled 2018-04-07: qty 200

## 2018-04-07 MED ORDER — IOPAMIDOL (ISOVUE-370) INJECTION 76%
INTRAVENOUS | Status: AC
  Filled 2018-04-07: qty 50

## 2018-04-07 MED ORDER — ACETAMINOPHEN 325 MG PO TABS: 325.0000 mg | ORAL_TABLET | ORAL | Status: DC | PRN

## 2018-04-07 MED ORDER — SODIUM CHLORIDE 0.9 % IV SOLN
INTRAVENOUS | Status: AC
  Filled 2018-04-07: qty 2

## 2018-04-07 MED ORDER — ONDANSETRON HCL 4 MG/2ML IJ SOLN: 4.0000 mg | Freq: Four times a day (QID) | INTRAMUSCULAR | Status: DC | PRN

## 2018-04-07 MED ORDER — LATANOPROST 0.005 % OP SOLN
1.0000 [drp] | Freq: Every day | OPHTHALMIC | Status: DC
  Administered 2018-04-07: 1 [drp] via OPHTHALMIC
  Filled 2018-04-07: qty 2.5

## 2018-04-07 MED ORDER — LIDOCAINE HCL 1 % IJ SOLN
INTRAMUSCULAR | Status: AC
  Filled 2018-04-07: qty 60

## 2018-04-07 MED ORDER — SODIUM CHLORIDE 0.9% FLUSH: 3.0000 mL | INTRAVENOUS | Status: DC | PRN

## 2018-04-07 MED ORDER — LIDOCAINE HCL (PF) 1 % IJ SOLN
INTRAMUSCULAR | Status: DC | PRN
  Administered 2018-04-07: 45 mL

## 2018-04-07 SURGICAL SUPPLY — 7 items
CABLE SURGICAL S-101-97-12 (CABLE) ×2 IMPLANT
LEAD TENDRIL MRI 52CM LPA1200M (Lead) ×1 IMPLANT
LEAD TENDRIL MRI 58CM LPA1200M (Lead) ×1 IMPLANT
PACEMAKER ASSURITY DR-RF (Pacemaker) ×1 IMPLANT
PAD DEFIB LIFELINK (PAD) ×1 IMPLANT
SHEATH CLASSIC 8F (SHEATH) ×2 IMPLANT
TRAY PACEMAKER INSERTION (PACKS) ×2 IMPLANT

## 2018-04-07 NOTE — Progress Notes (Signed)
Pt's BP is elevated. Pt is asymptomatic. RN paged MD for prn order. No new orders.

## 2018-04-07 NOTE — Discharge Instructions (Signed)
° ° °  Supplemental Discharge Instructions for  Pacemaker/Defibrillator Patients  Activity No heavy lifting or vigorous activity with your left/right arm for 6 to 8 weeks.  Do not raise your left/right arm above your head for one week.  Gradually raise your affected arm as drawn below.              04/11/18                       04/12/18                     04/13/18                     04/14/18 __  NO DRIVING for  1 week  ; you may begin driving on    05/18/34 .  WOUND CARE - Keep the wound area clean and dry.  Do not get this area wet, no showers until cleared to at your wound check visit . - The tape/steri-strips on your wound will fall off; do not pull them off.  No bandage is needed on the site.  DO  NOT apply any creams, oils, or ointments to the wound area. - If you notice any drainage or discharge from the wound, any swelling or bruising at the site, or you develop a fever > 101? F after you are discharged home, call the office at once.  Special Instructions - You are still able to use cellular telephones; use the ear opposite the side where you have your pacemaker/defibrillator.  Avoid carrying your cellular phone near your device. - When traveling through airports, show security personnel your identification card to avoid being screened in the metal detectors.  Ask the security personnel to use the hand wand. - Avoid arc welding equipment, MRI testing (magnetic resonance imaging), TENS units (transcutaneous nerve stimulators).  Call the office for questions about other devices. - Avoid electrical appliances that are in poor condition or are not properly grounded. - Microwave ovens are safe to be near or to operate.

## 2018-04-07 NOTE — Progress Notes (Signed)
Admission RN states she will see pt. 

## 2018-04-07 NOTE — Interval H&P Note (Signed)
History and Physical Interval Note:  04/07/2018 2:07 PM  Jonathon Butler.  has presented today for surgery, with the diagnosis of bradycardia  The various methods of treatment have been discussed with the patient and family. After consideration of risks, benefits and other options for treatment, the patient has consented to  Procedure(s): PACEMAKER IMPLANT (N/A) as a surgical intervention .  The patient's history has been reviewed, patient examined, no change in status, stable for surgery.  I have reviewed the patient's chart and labs.  Questions were answered to the patient's satisfaction.     Thompson Grayer

## 2018-04-07 NOTE — Plan of Care (Signed)

## 2018-04-08 ENCOUNTER — Encounter (HOSPITAL_COMMUNITY): Payer: Self-pay | Admitting: Internal Medicine

## 2018-04-08 ENCOUNTER — Ambulatory Visit (HOSPITAL_COMMUNITY): Payer: Medicare HMO

## 2018-04-08 ENCOUNTER — Other Ambulatory Visit: Payer: Self-pay

## 2018-04-08 DIAGNOSIS — I495 Sick sinus syndrome: Secondary | ICD-10-CM

## 2018-04-08 DIAGNOSIS — R001 Bradycardia, unspecified: Secondary | ICD-10-CM | POA: Diagnosis not present

## 2018-04-08 DIAGNOSIS — E785 Hyperlipidemia, unspecified: Secondary | ICD-10-CM | POA: Diagnosis not present

## 2018-04-08 DIAGNOSIS — G2581 Restless legs syndrome: Secondary | ICD-10-CM | POA: Diagnosis not present

## 2018-04-08 DIAGNOSIS — I453 Trifascicular block: Secondary | ICD-10-CM | POA: Diagnosis not present

## 2018-04-08 DIAGNOSIS — Z95810 Presence of automatic (implantable) cardiac defibrillator: Secondary | ICD-10-CM | POA: Diagnosis not present

## 2018-04-08 DIAGNOSIS — H409 Unspecified glaucoma: Secondary | ICD-10-CM | POA: Diagnosis not present

## 2018-04-08 DIAGNOSIS — Z87891 Personal history of nicotine dependence: Secondary | ICD-10-CM | POA: Diagnosis not present

## 2018-04-08 DIAGNOSIS — R55 Syncope and collapse: Secondary | ICD-10-CM | POA: Diagnosis not present

## 2018-04-08 DIAGNOSIS — I471 Supraventricular tachycardia: Secondary | ICD-10-CM | POA: Diagnosis not present

## 2018-04-08 DIAGNOSIS — H3530 Unspecified macular degeneration: Secondary | ICD-10-CM | POA: Diagnosis not present

## 2018-04-08 DIAGNOSIS — Z8249 Family history of ischemic heart disease and other diseases of the circulatory system: Secondary | ICD-10-CM | POA: Diagnosis not present

## 2018-04-08 LAB — BASIC METABOLIC PANEL
ANION GAP: 7 (ref 5–15)
BUN: 13 mg/dL (ref 8–23)
CALCIUM: 8.5 mg/dL — AB (ref 8.9–10.3)
CO2: 20 mmol/L — AB (ref 22–32)
CREATININE: 1.1 mg/dL (ref 0.61–1.24)
Chloride: 110 mmol/L (ref 98–111)
GFR calc Af Amer: >60 mL/min (ref >60)
GFR, EST NON AFRICAN AMERICAN: 55 mL/min — AB (ref >60)
GLUCOSE: 112 mg/dL — AB (ref 70–99)
Potassium: 4.2 mmol/L (ref 3.5–5.1)
Sodium: 137 mmol/L (ref 135–145)

## 2018-04-08 MED FILL — Lidocaine HCl Local Inj 1%: INTRAMUSCULAR | Qty: 60 | Status: AC

## 2018-04-08 MED FILL — Gentamicin Sulfate Inj 40 MG/ML: INTRAMUSCULAR | Qty: 80 | Status: AC

## 2018-04-08 NOTE — Progress Notes (Signed)
Discharge instructions, RX's and follow up appts explained and provided to patient and c/g verbalized understanding. Patient left floor via wheelchair accompanied by volunteers, no c/o pain or shortness of breath.  Sheela Mcculley, Tivis Ringer, RN

## 2018-04-08 NOTE — Progress Notes (Signed)
Doing well VSS Pocket without hematoma CXR reveals stable leads, no ptx  Device interrogation is personally reviewed and normal.  Thompson Grayer MD, [facility] 04/08/2018 8:19 AM

## 2018-04-08 NOTE — Discharge Summary (Signed)
ELECTROPHYSIOLOGY PROCEDURE DISCHARGE SUMMARY    Patient ID: Jonathon Butler.,  MRN: [id number], DOB/AGE: 82/19/1922 82 y.o.  Admit date: 04/07/2018 Discharge date: 04/08/2018  Primary Care Physician: Monico Blitz, MD Primary Cardiologist: Dr. Lorrine Kin Electrophysiologist: Dr. Rayann Heman  Primary Discharge Diagnosis:  1. Trifascicular block, symptomatic bradycardia 2. Near syncope  Secondary Discharge Diagnosis:  None   Allergies  Allergen Reactions  . Penicillins     Has patient had a PCN reaction causing immediate rash, facial/tongue/throat swelling, SOB or lightheadedness with hypotension: YES Has patient had a PCN reaction causing severe rash involving mucus membranes or skin necrosis: NO Has patient had a PCN reaction that required hospitalization: NO Has patient had a PCN reaction occurring within the last 10 years: NO If all of the above answers are "NO", then may proceed with Cephalosporin use.      Procedures This Admission:  1.  Implantation of a SJM dual chamber PPM on 04/07/18 by Dr Rayann Heman.  The patient received a [employer]urity MRI model [id number] (serial number  [id number]) pacemaker, [employer] Jude Medical Tendril MRI model [id number]- 52 (serial number  [id number]) right atrial lead and a [employer] Medical Tendril MRI model [id number]  (serial number  [id number]) right ventricular lead   There were no immediate post procedure complications. 2.  CXR on 04/08/18 demonstrated no pneumothorax status post device implantation.   Brief HPI: Jonathon Butler. is a 84 y.o. male was referred to electrophysiology in the outpatient setting for consideration of PPM implantation.  Past medical history includes dizziness, near syncope, bradycardia, conduction system disease.  The patient has had symptomatic bradycardia without reversible causes identified.  Risks, benefits, and alternatives to PPM implantation were reviewed with the patient who wished to proceed.    Hospital Course:  The patient was admitted and underwent implantation of a PPP with details as outlined above.  He was monitored on telemetry overnight which demonstrated SR, occ A pacing.  Left chest was without hematoma or ecchymosis.  The device was interrogated and found to be functioning normally.  CXR was obtained and demonstrated no pneumothorax status post device implantation.  Wound care, arm mobility, and restrictions were reviewed with the patient.  BP has been elevated, he is not known to be hypertensive, more recent reading improved, to follow out patient.  The patient feels well this morning, no CP or SOB, he was examined by Dr. Rayann Heman and considered stable for discharge to home.    Physical Exam: Vitals:   04/07/18 1942 04/07/18 1945 04/08/18 0008 04/08/18 0433  BP: (!) 174/24  (!) 188/77 132/67  Pulse:  67 61 (!) 59  Resp:  20 20 20   Temp:  (!) 97.5 F (36.4 C) (!) 97.4 F (36.3 C) 97.8 F (36.6 C)  TempSrc:  Oral Oral Oral  SpO2:  99% 94% 97%  Weight:    189 lb 9.6 oz (86 kg)  Height:        GEN- The patient is well appearing, elderly,  alert and oriented x 3 today.   HEENT: normocephalic, atraumatic; sclera clear, conjunctiva pink; hearing intact; oropharynx clear; neck supple, no JVP Lungs- CTA b/l, normal work of breathing.  No wheezes, rales, rhonchi Heart- RRR, no murmurs, rubs or gallops, PMI not laterally displaced GI- soft, non-tender, non-distended Extremities- no clubbing, cyanosis, or edema MS- no significant deformity or atrophy Skin- warm and dry, no rash or lesion, left chest without hematoma/ecchymosis Psych- euthymic mood, full affect Neuro-  no gross deficits   Labs:   Lab Results  Component Value Date   WBC 6.1 03/09/2018   HGB 12.2 (L) 03/09/2018   HCT 39.3 03/09/2018   MCV 91 03/09/2018   PLT 244 03/09/2018    Recent Labs  Lab 04/08/18 0608  NA 137  K 4.2  CL 110  CO2 20*  BUN 13  CREATININE 1.10  CALCIUM 8.5*  GLUCOSE 112*     Discharge Medications:  Allergies as of 04/08/2018      Reactions   Penicillins    Has patient had a PCN reaction causing immediate rash, facial/tongue/throat swelling, SOB or lightheadedness with hypotension: YES Has patient had a PCN reaction causing severe rash involving mucus membranes or skin necrosis: NO Has patient had a PCN reaction that required hospitalization: NO Has patient had a PCN reaction occurring within the last 10 years: NO If all of the above answers are "NO", then may proceed with Cephalosporin use.      Medication List    TAKE these medications   aspirin EC 81 MG tablet Take 1 tablet (81 mg total) by mouth daily.   dorzolamide 2 % ophthalmic solution Commonly known as:  TRUSOPT Place 1 drop into both eyes 2 (two) times daily.   latanoprost 0.005 % ophthalmic solution Commonly known as:  XALATAN Place 1 drop into both eyes at bedtime.   omeprazole 40 MG capsule Commonly known as:  PRILOSEC Take 40 mg by mouth daily.   tamsulosin 0.4 MG Caps capsule Commonly known as:  FLOMAX Take 0.4 mg by mouth daily.       Disposition:  Home  Discharge Instructions    Diet - low sodium heart healthy   Complete by:  As directed    Increase activity slowly   Complete by:  As directed      Follow-up Information    [facility] Follow up on 04/20/2018.   Specialty:  Cardiology Why:  11:30AM, wound check visit Contact information: [address], [place]       Thompson Grayer, MD Follow up on 07/15/2018.   Specialty:  Cardiology Why:  11:00AM Contact information: [place] [address] [phone]           Duration of Discharge Encounter: Greater than 30 minutes including physician time.  Venetia Night, PA-C 04/08/2018 8:49 AM

## 2018-04-15 DIAGNOSIS — Z299 Encounter for prophylactic measures, unspecified: Secondary | ICD-10-CM | POA: Diagnosis not present

## 2018-04-15 DIAGNOSIS — H353290 Exudative age-related macular degeneration, unspecified eye, stage unspecified: Secondary | ICD-10-CM | POA: Diagnosis not present

## 2018-04-15 DIAGNOSIS — Z6828 Body mass index (BMI) 28.0-28.9, adult: Secondary | ICD-10-CM | POA: Diagnosis not present

## 2018-04-15 DIAGNOSIS — R269 Unspecified abnormalities of gait and mobility: Secondary | ICD-10-CM | POA: Diagnosis not present

## 2018-04-15 DIAGNOSIS — Z713 Dietary counseling and surveillance: Secondary | ICD-10-CM | POA: Diagnosis not present

## 2018-04-17 ENCOUNTER — Ambulatory Visit: Payer: Medicare HMO | Admitting: Cardiovascular Disease

## 2018-04-20 ENCOUNTER — Ambulatory Visit (INDEPENDENT_AMBULATORY_CARE_PROVIDER_SITE_OTHER): Payer: Medicare HMO | Admitting: *Deleted

## 2018-04-20 DIAGNOSIS — Z950 Presence of cardiac pacemaker: Secondary | ICD-10-CM | POA: Diagnosis not present

## 2018-04-20 DIAGNOSIS — R001 Bradycardia, unspecified: Secondary | ICD-10-CM

## 2018-04-20 DIAGNOSIS — I453 Trifascicular block: Secondary | ICD-10-CM

## 2018-04-20 LAB — CUP PACEART INCLINIC DEVICE CHECK
Battery Remaining Longevity: 123 mo
Battery Voltage: 3.13 V
Brady Statistic RA Percent Paced: 73 %
Brady Statistic RV Percent Paced: 12 %
Implantable Lead Implant Date: 20190730
Implantable Lead Location: 753859
Implantable Pulse Generator Implant Date: 20190730
Lead Channel Impedance Value: 487.5 Ohm
Lead Channel Impedance Value: 637.5 Ohm
Lead Channel Pacing Threshold Pulse Width: 0.5 ms
Lead Channel Sensing Intrinsic Amplitude: 12 mV
Lead Channel Sensing Intrinsic Amplitude: 5 mV
Lead Channel Setting Pacing Amplitude: 3.5 V
Lead Channel Setting Pacing Pulse Width: 0.5 ms
MDC IDC LEAD IMPLANT DT: 20190730
MDC IDC LEAD LOCATION: 753860
MDC IDC MSMT LEADCHNL RA PACING THRESHOLD AMPLITUDE: 0.5 V
MDC IDC MSMT LEADCHNL RV PACING THRESHOLD AMPLITUDE: 0.5 V
MDC IDC MSMT LEADCHNL RV PACING THRESHOLD PULSEWIDTH: 0.5 ms
MDC IDC SESS DTM: 20190812125949
MDC IDC SET LEADCHNL RA PACING AMPLITUDE: 1.5 V
MDC IDC SET LEADCHNL RV SENSING SENSITIVITY: 2 mV
Pulse Gen Serial Number: 9049493

## 2018-04-20 NOTE — Progress Notes (Signed)
Wound check appointment. Steri-strips removed. Wound without redness or edema. Small scab noted at medial incision, no drainage, no stitch noted; incision edges otherwise approximated and healing well. Scattered superficial abrasions in various stages of healing noted where Tegaderm dressing and Steri-strips had been. Encouraged patient to wash site daily with soap and water. Educated about signs/symptoms of infection, patient agrees to call if any symptoms noted but has difficulty with his vision. Wound recheck on 04/27/18 with DC/R (per patient request).  Normal device function. Thresholds, sensing, and impedances consistent with implant measurements. Device programmed at 3.5V in RV with auto capture programmed on in RA for extra safety margin until 3 month visit. Histogram distribution appropriate for patient and level of activity. No mode switches or high ventricular rates noted. Patient and friend educated about wound care, arm mobility, lifting restrictions, and Merlin monitor. ROV with JA on 07/15/18.  Patient gave verbal permission to include the following photo:

## 2018-04-22 DIAGNOSIS — H401122 Primary open-angle glaucoma, left eye, moderate stage: Secondary | ICD-10-CM | POA: Diagnosis not present

## 2018-04-22 DIAGNOSIS — H401113 Primary open-angle glaucoma, right eye, severe stage: Secondary | ICD-10-CM | POA: Diagnosis not present

## 2018-04-27 ENCOUNTER — Telehealth: Payer: Self-pay

## 2018-04-27 ENCOUNTER — Ambulatory Visit: Payer: Medicare HMO

## 2018-04-27 NOTE — Telephone Encounter (Signed)
LVM on pts home phone to call Van regarding device clinic apt scheduled today at 11:00am. Also attempted to call pt's daughter number no longer in service.

## 2018-04-30 ENCOUNTER — Encounter (INDEPENDENT_AMBULATORY_CARE_PROVIDER_SITE_OTHER): Payer: Medicare HMO | Admitting: Ophthalmology

## 2018-04-30 DIAGNOSIS — H43813 Vitreous degeneration, bilateral: Secondary | ICD-10-CM

## 2018-04-30 DIAGNOSIS — H353231 Exudative age-related macular degeneration, bilateral, with active choroidal neovascularization: Secondary | ICD-10-CM | POA: Diagnosis not present

## 2018-05-12 DIAGNOSIS — E7800 Pure hypercholesterolemia, unspecified: Secondary | ICD-10-CM | POA: Diagnosis not present

## 2018-05-12 DIAGNOSIS — Z1331 Encounter for screening for depression: Secondary | ICD-10-CM | POA: Diagnosis not present

## 2018-05-12 DIAGNOSIS — R5383 Other fatigue: Secondary | ICD-10-CM | POA: Diagnosis not present

## 2018-05-12 DIAGNOSIS — Z299 Encounter for prophylactic measures, unspecified: Secondary | ICD-10-CM | POA: Diagnosis not present

## 2018-05-12 DIAGNOSIS — H353290 Exudative age-related macular degeneration, unspecified eye, stage unspecified: Secondary | ICD-10-CM | POA: Diagnosis not present

## 2018-05-12 DIAGNOSIS — Z79899 Other long term (current) drug therapy: Secondary | ICD-10-CM | POA: Diagnosis not present

## 2018-05-12 DIAGNOSIS — Z6828 Body mass index (BMI) 28.0-28.9, adult: Secondary | ICD-10-CM | POA: Diagnosis not present

## 2018-05-12 DIAGNOSIS — Z1339 Encounter for screening examination for other mental health and behavioral disorders: Secondary | ICD-10-CM | POA: Diagnosis not present

## 2018-05-12 DIAGNOSIS — Z125 Encounter for screening for malignant neoplasm of prostate: Secondary | ICD-10-CM | POA: Diagnosis not present

## 2018-05-12 DIAGNOSIS — Z7189 Other specified counseling: Secondary | ICD-10-CM | POA: Diagnosis not present

## 2018-05-12 DIAGNOSIS — Z0000 Encounter for general adult medical examination without abnormal findings: Secondary | ICD-10-CM | POA: Diagnosis not present

## 2018-06-11 ENCOUNTER — Encounter (INDEPENDENT_AMBULATORY_CARE_PROVIDER_SITE_OTHER): Payer: Medicare HMO | Admitting: Ophthalmology

## 2018-06-11 DIAGNOSIS — H353231 Exudative age-related macular degeneration, bilateral, with active choroidal neovascularization: Secondary | ICD-10-CM | POA: Diagnosis not present

## 2018-06-11 DIAGNOSIS — H43813 Vitreous degeneration, bilateral: Secondary | ICD-10-CM | POA: Diagnosis not present

## 2018-07-15 ENCOUNTER — Ambulatory Visit (INDEPENDENT_AMBULATORY_CARE_PROVIDER_SITE_OTHER): Payer: Medicare HMO | Admitting: Internal Medicine

## 2018-07-15 ENCOUNTER — Encounter: Payer: Medicare HMO | Admitting: Internal Medicine

## 2018-07-15 ENCOUNTER — Encounter: Payer: Self-pay | Admitting: Internal Medicine

## 2018-07-15 VITALS — BP 116/62 | HR 61 | Ht 72.0 in | Wt 196.4 lb

## 2018-07-15 DIAGNOSIS — R55 Syncope and collapse: Secondary | ICD-10-CM

## 2018-07-15 DIAGNOSIS — I495 Sick sinus syndrome: Secondary | ICD-10-CM | POA: Diagnosis not present

## 2018-07-15 DIAGNOSIS — I453 Trifascicular block: Secondary | ICD-10-CM | POA: Diagnosis not present

## 2018-07-15 NOTE — Progress Notes (Signed)
    PCP: Monico Blitz, MD Primary Cardiologist: Dr Bronson Ing Primary EP:  Dr Rayann Heman  Iona Beard Cleon Signorelli. is a 84 y.o. male who presents today for routine electrophysiology followup.  Since his pacemaker implant, the patient reports doing very well.  Today, he denies symptoms of palpitations, chest pain, shortness of breath,  lower extremity edema, dizziness, presyncope, or syncope.  The patient is otherwise without complaint today.   Past Medical History:  Diagnosis Date  . Abdominal pain   . Backache   . Bladder ulcer   . BPH (benign prostatic hyperplasia)   . Bradycardia   . Bronchitis   . Causalgia of lower limb   . Chest pain   . Constipation   . Dysphagia   . Elevated PSA   . Fatigue   . Gait difficulty   . GERD (gastroesophageal reflux disease)   . Glaucoma   . Gout   . History of kidney stones   . HOH (hard of hearing)   . Hx of gastroenteritis   . Hx of headache   . Hyperlipidemia   . Joint pain   . Macular degeneration   . Orthostatic hypotension   . Osteoarthritis   . Pancreatic cyst   . Restless leg   . Tick bite   . Toe pain   . Varicose veins of both lower extremities    Past Surgical History:  Procedure Laterality Date  . CHOLECYSTECTOMY    . PACEMAKER IMPLANT N/A 04/07/2018   St Jude Medical Assurity MRI conditional  dual-chamber pacemaker for symptomatic bradycardia by Dr Rayann Heman    ROS- all systems are reviewed and negative except as per HPI above  Current Outpatient Medications  Medication Sig Dispense Refill  . aspirin EC 81 MG tablet Take 1 tablet (81 mg total) by mouth daily. 90 tablet 3  . dorzolamide (TRUSOPT) 2 % ophthalmic solution Place 1 drop into both eyes 2 (two) times daily.   6  . latanoprost (XALATAN) 0.005 % ophthalmic solution Place 1 drop into both eyes at bedtime.  5  . omeprazole (PRILOSEC) 40 MG capsule Take 40 mg by mouth daily.    . tamsulosin (FLOMAX) 0.4 MG CAPS capsule Take 0.4 mg by mouth daily.      No current  facility-administered medications for this visit.     Physical Exam: Vitals:   07/15/18 1158  Height: 6' (1.829 m)    GEN- The patient is well appearing, alert and oriented x 3 today.   Head- normocephalic, atraumatic Eyes-  Sclera clear, conjunctiva pink Ears- hearing intact Oropharynx- clear Lungs- Clear to ausculation bilaterally, normal work of breathing Chest- pacemaker pocket is well healed Heart- Regular rate and rhythm, no murmurs, rubs or gallops, PMI not laterally displaced GI- soft, NT, ND, + BS Extremities- no clubbing, cyanosis, or edema  Pacemaker interrogation- reviewed in detail today,  See PACEART report  ekg tracing ordered today is personally reviewed and shows sinus rhythm, RBBB, LAHB  Assessment and Plan:  1. Syncope with trifascicular block as well as symptomatic sinus bradycardia Doing very well s/p PPM Normal pacemaker function See Pace Art report No changes today  Merlin Return in a year to see me in [place] office   Thompson Grayer MD, [facility] 07/15/2018 12:06 PM

## 2018-07-15 NOTE — Patient Instructions (Addendum)
Medication Instructions:  Your physician recommends that you continue on your current medications as directed. Please refer to the Current Medication list given to you today.  If you need a refill on your cardiac medications before your next appointment, please call your pharmacy.   Lab work: None Ordered  If you have labs (blood work) drawn today and your tests are completely normal, you will receive your results only by: Marland Kitchen MyChart Message (if you have MyChart) OR . A paper copy in the mail If you have any lab test that is abnormal or we need to change your treatment, we will call you to review the results.  Testing/Procedures: None ordered  Follow-Up: Remote monitoring is used to monitor your Pacemaker from home. This monitoring reduces the number of office visits required to check your device to one time per year. It allows [place] to keep an eye on the functioning of your device to ensure it is working properly. You are scheduled for a device check from home on 10/14/18. You may send your transmission at any time that day. If you have a wireless device, the transmission will be sent automatically. After your physician reviews your transmission, you will receive a postcard with your next transmission date.    Your physician wants you to follow-up in: 1 year with Dr. Rayann Heman in the [place] office. You will receive a reminder letter in the mail two months in advance. If you don't receive a letter, please call our office to schedule the follow-up appointment.  Any Other Special Instructions Will Be Listed Below (If Applicable).

## 2018-07-17 LAB — CUP PACEART INCLINIC DEVICE CHECK
Date Time Interrogation Session: 20191108104041
Implantable Lead Implant Date: 20190730
Implantable Lead Location: 753859
Implantable Lead Location: 753860
MDC IDC LEAD IMPLANT DT: 20190730
MDC IDC PG IMPLANT DT: 20190730
Pulse Gen Serial Number: 9049493

## 2018-07-23 ENCOUNTER — Encounter (INDEPENDENT_AMBULATORY_CARE_PROVIDER_SITE_OTHER): Payer: Medicare HMO | Admitting: Ophthalmology

## 2018-07-23 DIAGNOSIS — H353230 Exudative age-related macular degeneration, bilateral, stage unspecified: Secondary | ICD-10-CM | POA: Diagnosis not present

## 2018-07-23 DIAGNOSIS — H353231 Exudative age-related macular degeneration, bilateral, with active choroidal neovascularization: Secondary | ICD-10-CM

## 2018-07-23 DIAGNOSIS — H43813 Vitreous degeneration, bilateral: Secondary | ICD-10-CM

## 2018-07-23 DIAGNOSIS — H401113 Primary open-angle glaucoma, right eye, severe stage: Secondary | ICD-10-CM | POA: Diagnosis not present

## 2018-07-29 DIAGNOSIS — H401113 Primary open-angle glaucoma, right eye, severe stage: Secondary | ICD-10-CM | POA: Diagnosis not present

## 2018-07-29 DIAGNOSIS — H401122 Primary open-angle glaucoma, left eye, moderate stage: Secondary | ICD-10-CM | POA: Diagnosis not present

## 2018-08-14 ENCOUNTER — Encounter: Payer: Medicare HMO | Admitting: Internal Medicine

## 2018-09-10 ENCOUNTER — Encounter (INDEPENDENT_AMBULATORY_CARE_PROVIDER_SITE_OTHER): Payer: Medicare HMO | Admitting: Ophthalmology

## 2018-09-10 DIAGNOSIS — H353231 Exudative age-related macular degeneration, bilateral, with active choroidal neovascularization: Secondary | ICD-10-CM

## 2018-09-10 DIAGNOSIS — H43813 Vitreous degeneration, bilateral: Secondary | ICD-10-CM

## 2018-09-14 DIAGNOSIS — R2681 Unsteadiness on feet: Secondary | ICD-10-CM | POA: Diagnosis not present

## 2018-09-14 DIAGNOSIS — R109 Unspecified abdominal pain: Secondary | ICD-10-CM | POA: Diagnosis not present

## 2018-09-14 DIAGNOSIS — R791 Abnormal coagulation profile: Secondary | ICD-10-CM | POA: Diagnosis not present

## 2018-09-14 DIAGNOSIS — M545 Low back pain: Secondary | ICD-10-CM | POA: Diagnosis not present

## 2018-09-14 DIAGNOSIS — R911 Solitary pulmonary nodule: Secondary | ICD-10-CM | POA: Diagnosis not present

## 2018-09-14 DIAGNOSIS — S3991XA Unspecified injury of abdomen, initial encounter: Secondary | ICD-10-CM | POA: Diagnosis not present

## 2018-09-14 DIAGNOSIS — M79605 Pain in left leg: Secondary | ICD-10-CM | POA: Diagnosis not present

## 2018-09-14 DIAGNOSIS — M6281 Muscle weakness (generalized): Secondary | ICD-10-CM | POA: Diagnosis not present

## 2018-09-14 DIAGNOSIS — S299XXA Unspecified injury of thorax, initial encounter: Secondary | ICD-10-CM | POA: Diagnosis not present

## 2018-09-14 DIAGNOSIS — M79604 Pain in right leg: Secondary | ICD-10-CM | POA: Diagnosis not present

## 2018-09-14 DIAGNOSIS — W101XXA Fall (on)(from) sidewalk curb, initial encounter: Secondary | ICD-10-CM | POA: Diagnosis not present

## 2018-09-14 DIAGNOSIS — R51 Headache: Secondary | ICD-10-CM | POA: Diagnosis not present

## 2018-09-14 DIAGNOSIS — R531 Weakness: Secondary | ICD-10-CM | POA: Diagnosis not present

## 2018-09-14 DIAGNOSIS — W010XXA Fall on same level from slipping, tripping and stumbling without subsequent striking against object, initial encounter: Secondary | ICD-10-CM | POA: Diagnosis not present

## 2018-09-14 DIAGNOSIS — N390 Urinary tract infection, site not specified: Secondary | ICD-10-CM | POA: Diagnosis not present

## 2018-09-15 DIAGNOSIS — Y92007 Garden or yard of unspecified non-institutional (private) residence as the place of occurrence of the external cause: Secondary | ICD-10-CM | POA: Diagnosis not present

## 2018-09-15 DIAGNOSIS — N390 Urinary tract infection, site not specified: Secondary | ICD-10-CM | POA: Diagnosis not present

## 2018-09-15 DIAGNOSIS — M545 Low back pain: Secondary | ICD-10-CM | POA: Diagnosis not present

## 2018-09-15 DIAGNOSIS — W010XXA Fall on same level from slipping, tripping and stumbling without subsequent striking against object, initial encounter: Secondary | ICD-10-CM | POA: Diagnosis not present

## 2018-09-16 DIAGNOSIS — Y92007 Garden or yard of unspecified non-institutional (private) residence as the place of occurrence of the external cause: Secondary | ICD-10-CM | POA: Diagnosis not present

## 2018-09-16 DIAGNOSIS — M545 Low back pain: Secondary | ICD-10-CM | POA: Diagnosis not present

## 2018-09-16 DIAGNOSIS — W010XXA Fall on same level from slipping, tripping and stumbling without subsequent striking against object, initial encounter: Secondary | ICD-10-CM | POA: Diagnosis not present

## 2018-09-16 DIAGNOSIS — N390 Urinary tract infection, site not specified: Secondary | ICD-10-CM | POA: Diagnosis not present

## 2018-09-21 DIAGNOSIS — R600 Localized edema: Secondary | ICD-10-CM | POA: Diagnosis not present

## 2018-09-21 DIAGNOSIS — Z0389 Encounter for observation for other suspected diseases and conditions ruled out: Secondary | ICD-10-CM | POA: Diagnosis not present

## 2018-09-21 DIAGNOSIS — M79606 Pain in leg, unspecified: Secondary | ICD-10-CM | POA: Diagnosis not present

## 2018-09-23 DIAGNOSIS — Z789 Other specified health status: Secondary | ICD-10-CM | POA: Diagnosis not present

## 2018-09-23 DIAGNOSIS — M79606 Pain in leg, unspecified: Secondary | ICD-10-CM | POA: Diagnosis not present

## 2018-09-23 DIAGNOSIS — R911 Solitary pulmonary nodule: Secondary | ICD-10-CM | POA: Diagnosis not present

## 2018-09-23 DIAGNOSIS — Z299 Encounter for prophylactic measures, unspecified: Secondary | ICD-10-CM | POA: Diagnosis not present

## 2018-09-23 DIAGNOSIS — E7800 Pure hypercholesterolemia, unspecified: Secondary | ICD-10-CM | POA: Diagnosis not present

## 2018-09-23 DIAGNOSIS — Z6828 Body mass index (BMI) 28.0-28.9, adult: Secondary | ICD-10-CM | POA: Diagnosis not present

## 2018-09-23 DIAGNOSIS — H353290 Exudative age-related macular degeneration, unspecified eye, stage unspecified: Secondary | ICD-10-CM | POA: Diagnosis not present

## 2018-10-14 ENCOUNTER — Ambulatory Visit (INDEPENDENT_AMBULATORY_CARE_PROVIDER_SITE_OTHER): Payer: Medicare HMO

## 2018-10-14 DIAGNOSIS — I453 Trifascicular block: Secondary | ICD-10-CM

## 2018-10-14 DIAGNOSIS — R001 Bradycardia, unspecified: Secondary | ICD-10-CM

## 2018-10-16 LAB — CUP PACEART REMOTE DEVICE CHECK
Battery Remaining Longevity: 126 mo
Brady Statistic AP VP Percent: 5.2 %
Brady Statistic AP VS Percent: 72 %
Brady Statistic AS VP Percent: 1 %
Brady Statistic AS VS Percent: 23 %
Brady Statistic RV Percent Paced: 5.3 %
Implantable Lead Implant Date: 20190730
Implantable Lead Location: 753859
Implantable Pulse Generator Implant Date: 20190730
Lead Channel Impedance Value: 540 Ohm
Lead Channel Pacing Threshold Amplitude: 0.625 V
Lead Channel Pacing Threshold Pulse Width: 0.5 ms
Lead Channel Sensing Intrinsic Amplitude: 12 mV
Lead Channel Setting Pacing Amplitude: 0.875
Lead Channel Setting Pacing Amplitude: 1.75 V
Lead Channel Setting Pacing Pulse Width: 0.5 ms
MDC IDC LEAD IMPLANT DT: 20190730
MDC IDC LEAD LOCATION: 753860
MDC IDC MSMT BATTERY REMAINING PERCENTAGE: 95.5 %
MDC IDC MSMT BATTERY VOLTAGE: 3.04 V
MDC IDC MSMT LEADCHNL RA PACING THRESHOLD AMPLITUDE: 0.75 V
MDC IDC MSMT LEADCHNL RA PACING THRESHOLD PULSEWIDTH: 0.5 ms
MDC IDC MSMT LEADCHNL RA SENSING INTR AMPL: 4.5 mV
MDC IDC MSMT LEADCHNL RV IMPEDANCE VALUE: 560 Ohm
MDC IDC SESS DTM: 20200205070015
MDC IDC SET LEADCHNL RV SENSING SENSITIVITY: 2 mV
MDC IDC STAT BRADY RA PERCENT PACED: 77 %
Pulse Gen Model: 2272
Pulse Gen Serial Number: 9049493

## 2018-10-21 DIAGNOSIS — H401122 Primary open-angle glaucoma, left eye, moderate stage: Secondary | ICD-10-CM | POA: Diagnosis not present

## 2018-10-23 NOTE — Progress Notes (Signed)
Remote pacemaker transmission.   

## 2018-11-05 ENCOUNTER — Encounter (INDEPENDENT_AMBULATORY_CARE_PROVIDER_SITE_OTHER): Payer: Medicare HMO | Admitting: Ophthalmology

## 2018-11-05 DIAGNOSIS — H43813 Vitreous degeneration, bilateral: Secondary | ICD-10-CM

## 2018-11-05 DIAGNOSIS — H353231 Exudative age-related macular degeneration, bilateral, with active choroidal neovascularization: Secondary | ICD-10-CM | POA: Diagnosis not present

## 2018-11-11 DIAGNOSIS — M19079 Primary osteoarthritis, unspecified ankle and foot: Secondary | ICD-10-CM | POA: Diagnosis not present

## 2018-11-11 DIAGNOSIS — Z6828 Body mass index (BMI) 28.0-28.9, adult: Secondary | ICD-10-CM | POA: Diagnosis not present

## 2018-11-11 DIAGNOSIS — K219 Gastro-esophageal reflux disease without esophagitis: Secondary | ICD-10-CM | POA: Diagnosis not present

## 2018-11-11 DIAGNOSIS — N400 Enlarged prostate without lower urinary tract symptoms: Secondary | ICD-10-CM | POA: Diagnosis not present

## 2018-11-11 DIAGNOSIS — Z299 Encounter for prophylactic measures, unspecified: Secondary | ICD-10-CM | POA: Diagnosis not present

## 2018-11-11 DIAGNOSIS — M722 Plantar fascial fibromatosis: Secondary | ICD-10-CM | POA: Diagnosis not present

## 2018-11-26 ENCOUNTER — Telehealth: Payer: Self-pay | Admitting: Cardiovascular Disease

## 2018-11-26 NOTE — Telephone Encounter (Signed)
I called and spoke with the patient.  He is doing well from a cardiac perspective.  He is very agreeable to having his appointment cancelled given the [place] pandemic, in order to avoid unnecessary exposure. As his primary active cardiac issue is having a pacemaker, he can follow-up with Dr. Rayann Heman as scheduled.  He can follow-up with me as needed.

## 2018-12-01 ENCOUNTER — Ambulatory Visit: Payer: Medicare HMO | Admitting: Cardiovascular Disease

## 2019-01-13 ENCOUNTER — Other Ambulatory Visit: Payer: Self-pay

## 2019-01-13 ENCOUNTER — Encounter (INDEPENDENT_AMBULATORY_CARE_PROVIDER_SITE_OTHER): Payer: Medicare HMO | Admitting: Ophthalmology

## 2019-01-13 DIAGNOSIS — H353231 Exudative age-related macular degeneration, bilateral, with active choroidal neovascularization: Secondary | ICD-10-CM | POA: Diagnosis not present

## 2019-01-13 DIAGNOSIS — H43813 Vitreous degeneration, bilateral: Secondary | ICD-10-CM | POA: Diagnosis not present

## 2019-01-20 ENCOUNTER — Other Ambulatory Visit: Payer: Self-pay

## 2019-01-20 ENCOUNTER — Ambulatory Visit (INDEPENDENT_AMBULATORY_CARE_PROVIDER_SITE_OTHER): Payer: Medicare HMO | Admitting: *Deleted

## 2019-01-20 DIAGNOSIS — R001 Bradycardia, unspecified: Secondary | ICD-10-CM

## 2019-01-20 DIAGNOSIS — I495 Sick sinus syndrome: Secondary | ICD-10-CM

## 2019-01-21 LAB — CUP PACEART REMOTE DEVICE CHECK
Battery Remaining Percentage: 95 %
Battery Voltage: 3.02 V
Brady Statistic RA Percent Paced: 79 %
Brady Statistic RV Percent Paced: 10 %
Date Time Interrogation Session: 20200514084635
Implantable Lead Implant Date: 20190730
Implantable Lead Implant Date: 20190730
Implantable Lead Location: 753859
Implantable Lead Location: 753860
Implantable Pulse Generator Implant Date: 20190730
Lead Channel Impedance Value: 490 Ohm
Lead Channel Impedance Value: 540 Ohm
Lead Channel Pacing Threshold Amplitude: 0.625 V
Lead Channel Pacing Threshold Amplitude: 0.75 V
Lead Channel Pacing Threshold Pulse Width: 0.5 ms
Lead Channel Pacing Threshold Pulse Width: 0.5 ms
Lead Channel Sensing Intrinsic Amplitude: 12 mV
Lead Channel Sensing Intrinsic Amplitude: 4.2 mV
Lead Channel Setting Pacing Amplitude: 0.875
Lead Channel Setting Pacing Amplitude: 1.75 V
Lead Channel Setting Pacing Pulse Width: 0.5 ms
Lead Channel Setting Sensing Sensitivity: 2 mV
Pulse Gen Model: 2272
Pulse Gen Serial Number: 9049493

## 2019-02-03 DIAGNOSIS — Z6828 Body mass index (BMI) 28.0-28.9, adult: Secondary | ICD-10-CM | POA: Diagnosis not present

## 2019-02-03 DIAGNOSIS — Z299 Encounter for prophylactic measures, unspecified: Secondary | ICD-10-CM | POA: Diagnosis not present

## 2019-02-03 DIAGNOSIS — M19079 Primary osteoarthritis, unspecified ankle and foot: Secondary | ICD-10-CM | POA: Diagnosis not present

## 2019-02-03 DIAGNOSIS — H353290 Exudative age-related macular degeneration, unspecified eye, stage unspecified: Secondary | ICD-10-CM | POA: Diagnosis not present

## 2019-02-03 DIAGNOSIS — G2581 Restless legs syndrome: Secondary | ICD-10-CM | POA: Diagnosis not present

## 2019-02-05 NOTE — Progress Notes (Signed)
Remote pacemaker transmission.   

## 2019-02-22 DIAGNOSIS — M19071 Primary osteoarthritis, right ankle and foot: Secondary | ICD-10-CM | POA: Diagnosis not present

## 2019-02-22 DIAGNOSIS — I739 Peripheral vascular disease, unspecified: Secondary | ICD-10-CM | POA: Diagnosis not present

## 2019-02-24 DIAGNOSIS — I8390 Asymptomatic varicose veins of unspecified lower extremity: Secondary | ICD-10-CM | POA: Diagnosis not present

## 2019-02-24 DIAGNOSIS — R0989 Other specified symptoms and signs involving the circulatory and respiratory systems: Secondary | ICD-10-CM | POA: Diagnosis not present

## 2019-03-08 DIAGNOSIS — M25571 Pain in right ankle and joints of right foot: Secondary | ICD-10-CM | POA: Diagnosis not present

## 2019-03-08 DIAGNOSIS — M19071 Primary osteoarthritis, right ankle and foot: Secondary | ICD-10-CM | POA: Diagnosis not present

## 2019-03-24 ENCOUNTER — Other Ambulatory Visit: Payer: Self-pay

## 2019-03-24 ENCOUNTER — Encounter (INDEPENDENT_AMBULATORY_CARE_PROVIDER_SITE_OTHER): Payer: Medicare HMO | Admitting: Ophthalmology

## 2019-03-24 DIAGNOSIS — H43813 Vitreous degeneration, bilateral: Secondary | ICD-10-CM | POA: Diagnosis not present

## 2019-03-24 DIAGNOSIS — H353231 Exudative age-related macular degeneration, bilateral, with active choroidal neovascularization: Secondary | ICD-10-CM | POA: Diagnosis not present

## 2019-03-30 DIAGNOSIS — H401122 Primary open-angle glaucoma, left eye, moderate stage: Secondary | ICD-10-CM | POA: Diagnosis not present

## 2019-04-01 ENCOUNTER — Encounter: Payer: Self-pay | Admitting: Internal Medicine

## 2019-04-21 ENCOUNTER — Ambulatory Visit (INDEPENDENT_AMBULATORY_CARE_PROVIDER_SITE_OTHER): Payer: Medicare HMO | Admitting: *Deleted

## 2019-04-21 DIAGNOSIS — I495 Sick sinus syndrome: Secondary | ICD-10-CM | POA: Diagnosis not present

## 2019-04-21 LAB — CUP PACEART REMOTE DEVICE CHECK
Battery Remaining Longevity: 116 mo
Battery Remaining Percentage: 95.5 %
Battery Voltage: 3.02 V
Brady Statistic AP VP Percent: 12 %
Brady Statistic AP VS Percent: 68 %
Brady Statistic AS VP Percent: 1 %
Brady Statistic AS VS Percent: 20 %
Brady Statistic RA Percent Paced: 79 %
Brady Statistic RV Percent Paced: 12 %
Date Time Interrogation Session: 20200811060014
Implantable Lead Implant Date: 20190730
Implantable Lead Implant Date: 20190730
Implantable Lead Location: 753859
Implantable Lead Location: 753860
Implantable Pulse Generator Implant Date: 20190730
Lead Channel Impedance Value: 490 Ohm
Lead Channel Impedance Value: 560 Ohm
Lead Channel Pacing Threshold Amplitude: 0.75 V
Lead Channel Pacing Threshold Amplitude: 0.75 V
Lead Channel Pacing Threshold Pulse Width: 0.5 ms
Lead Channel Pacing Threshold Pulse Width: 0.5 ms
Lead Channel Sensing Intrinsic Amplitude: 12 mV
Lead Channel Sensing Intrinsic Amplitude: 4.3 mV
Lead Channel Setting Pacing Amplitude: 1 V
Lead Channel Setting Pacing Amplitude: 1.75 V
Lead Channel Setting Pacing Pulse Width: 0.5 ms
Lead Channel Setting Sensing Sensitivity: 2 mV
Pulse Gen Model: 2272
Pulse Gen Serial Number: 9049493

## 2019-04-27 DIAGNOSIS — H401122 Primary open-angle glaucoma, left eye, moderate stage: Secondary | ICD-10-CM | POA: Diagnosis not present

## 2019-04-29 ENCOUNTER — Encounter: Payer: Self-pay | Admitting: Cardiology

## 2019-04-29 NOTE — Progress Notes (Signed)
Remote pacemaker transmission.   

## 2019-05-18 DIAGNOSIS — N419 Inflammatory disease of prostate, unspecified: Secondary | ICD-10-CM | POA: Diagnosis not present

## 2019-05-18 DIAGNOSIS — Z7189 Other specified counseling: Secondary | ICD-10-CM | POA: Diagnosis not present

## 2019-05-18 DIAGNOSIS — Z1211 Encounter for screening for malignant neoplasm of colon: Secondary | ICD-10-CM | POA: Diagnosis not present

## 2019-05-18 DIAGNOSIS — Z0000 Encounter for general adult medical examination without abnormal findings: Secondary | ICD-10-CM | POA: Diagnosis not present

## 2019-05-18 DIAGNOSIS — Z125 Encounter for screening for malignant neoplasm of prostate: Secondary | ICD-10-CM | POA: Diagnosis not present

## 2019-05-18 DIAGNOSIS — E7800 Pure hypercholesterolemia, unspecified: Secondary | ICD-10-CM | POA: Diagnosis not present

## 2019-05-18 DIAGNOSIS — Z1331 Encounter for screening for depression: Secondary | ICD-10-CM | POA: Diagnosis not present

## 2019-05-18 DIAGNOSIS — Z1339 Encounter for screening examination for other mental health and behavioral disorders: Secondary | ICD-10-CM | POA: Diagnosis not present

## 2019-05-18 DIAGNOSIS — R5383 Other fatigue: Secondary | ICD-10-CM | POA: Diagnosis not present

## 2019-05-18 DIAGNOSIS — Z79899 Other long term (current) drug therapy: Secondary | ICD-10-CM | POA: Diagnosis not present

## 2019-05-18 DIAGNOSIS — Z6827 Body mass index (BMI) 27.0-27.9, adult: Secondary | ICD-10-CM | POA: Diagnosis not present

## 2019-05-18 DIAGNOSIS — R350 Frequency of micturition: Secondary | ICD-10-CM | POA: Diagnosis not present

## 2019-05-25 DIAGNOSIS — H401122 Primary open-angle glaucoma, left eye, moderate stage: Secondary | ICD-10-CM | POA: Diagnosis not present

## 2019-06-02 ENCOUNTER — Encounter (INDEPENDENT_AMBULATORY_CARE_PROVIDER_SITE_OTHER): Payer: Medicare HMO | Admitting: Ophthalmology

## 2019-06-02 ENCOUNTER — Other Ambulatory Visit: Payer: Self-pay

## 2019-06-02 DIAGNOSIS — H43813 Vitreous degeneration, bilateral: Secondary | ICD-10-CM | POA: Diagnosis not present

## 2019-06-02 DIAGNOSIS — H353231 Exudative age-related macular degeneration, bilateral, with active choroidal neovascularization: Secondary | ICD-10-CM

## 2019-06-09 DIAGNOSIS — H353231 Exudative age-related macular degeneration, bilateral, with active choroidal neovascularization: Secondary | ICD-10-CM | POA: Diagnosis not present

## 2019-06-09 DIAGNOSIS — H40012 Open angle with borderline findings, low risk, left eye: Secondary | ICD-10-CM | POA: Diagnosis not present

## 2019-06-09 DIAGNOSIS — H401113 Primary open-angle glaucoma, right eye, severe stage: Secondary | ICD-10-CM | POA: Diagnosis not present

## 2019-06-09 DIAGNOSIS — Z961 Presence of intraocular lens: Secondary | ICD-10-CM | POA: Diagnosis not present

## 2019-06-14 DIAGNOSIS — H353231 Exudative age-related macular degeneration, bilateral, with active choroidal neovascularization: Secondary | ICD-10-CM | POA: Diagnosis not present

## 2019-06-14 DIAGNOSIS — H40012 Open angle with borderline findings, low risk, left eye: Secondary | ICD-10-CM | POA: Diagnosis not present

## 2019-06-14 DIAGNOSIS — H401113 Primary open-angle glaucoma, right eye, severe stage: Secondary | ICD-10-CM | POA: Diagnosis not present

## 2019-06-14 DIAGNOSIS — Z961 Presence of intraocular lens: Secondary | ICD-10-CM | POA: Diagnosis not present

## 2019-06-16 DIAGNOSIS — Z6827 Body mass index (BMI) 27.0-27.9, adult: Secondary | ICD-10-CM | POA: Diagnosis not present

## 2019-06-16 DIAGNOSIS — M1990 Unspecified osteoarthritis, unspecified site: Secondary | ICD-10-CM | POA: Diagnosis not present

## 2019-06-16 DIAGNOSIS — R609 Edema, unspecified: Secondary | ICD-10-CM | POA: Diagnosis not present

## 2019-06-16 DIAGNOSIS — R350 Frequency of micturition: Secondary | ICD-10-CM | POA: Diagnosis not present

## 2019-06-16 DIAGNOSIS — N419 Inflammatory disease of prostate, unspecified: Secondary | ICD-10-CM | POA: Diagnosis not present

## 2019-06-16 DIAGNOSIS — Z299 Encounter for prophylactic measures, unspecified: Secondary | ICD-10-CM | POA: Diagnosis not present

## 2019-06-17 DIAGNOSIS — H401113 Primary open-angle glaucoma, right eye, severe stage: Secondary | ICD-10-CM | POA: Diagnosis not present

## 2019-06-17 DIAGNOSIS — H40012 Open angle with borderline findings, low risk, left eye: Secondary | ICD-10-CM | POA: Diagnosis not present

## 2019-06-21 DIAGNOSIS — L600 Ingrowing nail: Secondary | ICD-10-CM | POA: Diagnosis not present

## 2019-06-21 DIAGNOSIS — M79674 Pain in right toe(s): Secondary | ICD-10-CM | POA: Diagnosis not present

## 2019-06-21 DIAGNOSIS — B351 Tinea unguium: Secondary | ICD-10-CM | POA: Diagnosis not present

## 2019-06-21 DIAGNOSIS — M79675 Pain in left toe(s): Secondary | ICD-10-CM | POA: Diagnosis not present

## 2019-06-29 ENCOUNTER — Ambulatory Visit (INDEPENDENT_AMBULATORY_CARE_PROVIDER_SITE_OTHER): Payer: Medicare HMO | Admitting: Urology

## 2019-06-29 DIAGNOSIS — N401 Enlarged prostate with lower urinary tract symptoms: Secondary | ICD-10-CM | POA: Diagnosis not present

## 2019-06-29 DIAGNOSIS — Z8551 Personal history of malignant neoplasm of bladder: Secondary | ICD-10-CM | POA: Diagnosis not present

## 2019-06-29 DIAGNOSIS — N3000 Acute cystitis without hematuria: Secondary | ICD-10-CM

## 2019-07-16 ENCOUNTER — Encounter: Payer: Medicare HMO | Admitting: Internal Medicine

## 2019-07-21 ENCOUNTER — Ambulatory Visit (INDEPENDENT_AMBULATORY_CARE_PROVIDER_SITE_OTHER): Payer: Medicare HMO | Admitting: *Deleted

## 2019-07-21 DIAGNOSIS — I495 Sick sinus syndrome: Secondary | ICD-10-CM | POA: Diagnosis not present

## 2019-07-21 DIAGNOSIS — I453 Trifascicular block: Secondary | ICD-10-CM

## 2019-07-21 LAB — CUP PACEART REMOTE DEVICE CHECK
Battery Remaining Longevity: 124 mo
Battery Remaining Percentage: 95.5 %
Battery Voltage: 3.02 V
Brady Statistic AP VP Percent: 12 %
Brady Statistic AP VS Percent: 68 %
Brady Statistic AS VP Percent: 1 %
Brady Statistic AS VS Percent: 20 %
Brady Statistic RA Percent Paced: 79 %
Brady Statistic RV Percent Paced: 12 %
Date Time Interrogation Session: 20201110070018
Implantable Lead Implant Date: 20190730
Implantable Lead Implant Date: 20190730
Implantable Lead Location: 753859
Implantable Lead Location: 753860
Implantable Pulse Generator Implant Date: 20190730
Lead Channel Impedance Value: 480 Ohm
Lead Channel Impedance Value: 540 Ohm
Lead Channel Pacing Threshold Amplitude: 0.75 V
Lead Channel Pacing Threshold Amplitude: 0.75 V
Lead Channel Pacing Threshold Pulse Width: 0.5 ms
Lead Channel Pacing Threshold Pulse Width: 0.5 ms
Lead Channel Sensing Intrinsic Amplitude: 12 mV
Lead Channel Sensing Intrinsic Amplitude: 4.4 mV
Lead Channel Setting Pacing Amplitude: 1 V
Lead Channel Setting Pacing Amplitude: 1.75 V
Lead Channel Setting Pacing Pulse Width: 0.5 ms
Lead Channel Setting Sensing Sensitivity: 2 mV
Pulse Gen Model: 2272
Pulse Gen Serial Number: 9049493

## 2019-08-02 ENCOUNTER — Telehealth: Payer: Self-pay | Admitting: Internal Medicine

## 2019-08-02 NOTE — Telephone Encounter (Signed)
Virtual Visit Pre-Appointment Phone Call  "(Name), I am calling you today to discuss your upcoming appointment. We are currently trying to limit exposure to the virus that causes COVID-19 by seeing patients at home rather than in the office."  1. "What is the BEST phone number to call the day of the visit?" - [phone]  2. Do you have or have access to (through a family member/friend) a smartphone with video capability that we can use for your visit?" a. If yes - list this number in appt notes as cell (if different from BEST phone #) and list the appointment type as a VIDEO visit in appointment notes b. If no - list the appointment type as a PHONE visit in appointment notes  3. Confirm consent - "In the setting of the current Covid19 crisis, you are scheduled for a (phone or video) visit with your provider on (date) at (time).  Just as we do with many in-office visits, in order for you to participate in this visit, we must obtain consent.  If you'd like, I can send this to your mychart (if signed up) or email for you to review.  Otherwise, I can obtain your verbal consent now.  All virtual visits are billed to your insurance company just like a normal visit would be.  By agreeing to a virtual visit, we'd like you to understand that the technology does not allow for your provider to perform an examination, and thus may limit your provider's ability to fully assess your condition. If your provider identifies any concerns that need to be evaluated in person, we will make arrangements to do so.  Finally, though the technology is pretty good, we cannot assure that it will always work on either your or our end, and in the setting of a video visit, we may have to convert it to a phone-only visit.  In either situation, we cannot ensure that we have a secure connection.  Are you willing to proceed?" STAFF: Did the patient verbally acknowledge consent to telehealth visit? Document YES/NO here:  YES  4. Advise patient to be prepared - "Two hours prior to your appointment, go ahead and check your blood pressure, pulse, oxygen saturation, and your weight (if you have the equipment to check those) and write them all down. When your visit starts, your provider will ask you for this information. If you have an Apple Watch or Kardia device, please plan to have heart rate information ready on the day of your appointment. Please have a pen and paper handy nearby the day of the visit as well."  5. Give patient instructions for MyChart download to smartphone OR Doximity/Doxy.me as below if video visit (depending on what platform provider is using)  6. Inform patient they will receive a phone call 15 minutes prior to their appointment time (may be from unknown caller ID) so they should be prepared to answer    TELEPHONE CALL NOTE  Jonathon Butler. has been deemed a candidate for a follow-up tele-health visit to limit community exposure during the Covid-19 pandemic. I spoke with the patient via phone to ensure availability of phone/video source, confirm preferred email & phone number, and discuss instructions and expectations.  I reminded Jonathon Klopf. to be prepared with any vital sign and/or heart rhythm information that could potentially be obtained via home monitoring, at the time of his visit. I reminded Jonathon Nigh. to expect a phone call prior to his  visit.  Jonathon Butler 08/02/2019 10:09 AM   INSTRUCTIONS FOR DOWNLOADING THE MYCHART APP TO SMARTPHONE  - The patient must first make sure to have activated MyChart and know their login information - If Apple, go to [employer] and type in MyChart in the search bar and download the app. If Android, ask patient to go to [employer] and type in [place] in the search bar and download the app. The app is free but as with any other app downloads, their phone may require them to verify saved payment information or  Apple/Android password.  - The patient will need to then log into the app with their MyChart username and password, and select [facility] as their healthcare provider to link the account. When it is time for your visit, go to the MyChart app, find appointments, and click Begin Video Visit. Be sure to Select Allow for your device to access the Microphone and Camera for your visit. You will then be connected, and your provider will be with you shortly.  **If they have any issues connecting, or need assistance please contact MyChart service desk [phone])**  **If using a computer, in order to ensure the best quality for their visit they will need to use either of the following Internet Browsers: [employer], or Google Chrome**  IF USING DOXIMITY or DOXY.ME - The patient will receive a link just prior to their visit by text.     FULL LENGTH CONSENT FOR TELE-HEALTH VISIT   I hereby voluntarily request, consent and authorize [facility] and its employed or contracted physicians, physician assistants, nurse practitioners or other licensed health care professionals (the Practitioner), to provide me with telemedicine health care services (the Services") as deemed necessary by the treating Practitioner. I acknowledge and consent to receive the Services by the Practitioner via telemedicine. I understand that the telemedicine visit will involve communicating with the Practitioner through live audiovisual communication technology and the disclosure of certain medical information by electronic transmission. I acknowledge that I have been given the opportunity to request an in-person assessment or other available alternative prior to the telemedicine visit and am voluntarily participating in the telemedicine visit.  I understand that I have the right to withhold or withdraw my consent to the use of telemedicine in the course of my care at any time, without affecting my right to future care  or treatment, and that the Practitioner or I may terminate the telemedicine visit at any time. I understand that I have the right to inspect all information obtained and/or recorded in the course of the telemedicine visit and may receive copies of available information for a reasonable fee.  I understand that some of the potential risks of receiving the Services via telemedicine include:   Delay or interruption in medical evaluation due to technological equipment failure or disruption;  Information transmitted may not be sufficient (e.g. poor resolution of images) to allow for appropriate medical decision making by the Practitioner; and/or   In rare instances, security protocols could fail, causing a breach of personal health information.  Furthermore, I acknowledge that it is my responsibility to provide information about my medical history, conditions and care that is complete and accurate to the best of my ability. I acknowledge that Practitioner's advice, recommendations, and/or decision may be based on factors not within their control, such as incomplete or inaccurate data provided by me or distortions of diagnostic images or specimens that may result from electronic transmissions. I understand  that the practice of medicine is not an exact science and that Practitioner makes no warranties or guarantees regarding treatment outcomes. I acknowledge that I will receive a copy of this consent concurrently upon execution via email to the email address I last provided but may also request a printed copy by calling the office of [facility].    I understand that my insurance will be billed for this visit.   I have read or had this consent read to me.  I understand the contents of this consent, which adequately explains the benefits and risks of the Services being provided via telemedicine.   I have been provided ample opportunity to ask questions regarding this consent and the Services and have had  my questions answered to my satisfaction.  I give my informed consent for the services to be provided through the use of telemedicine in my medical care  By participating in this telemedicine visit I agree to the above.

## 2019-08-03 ENCOUNTER — Other Ambulatory Visit: Payer: Self-pay

## 2019-08-03 ENCOUNTER — Ambulatory Visit (INDEPENDENT_AMBULATORY_CARE_PROVIDER_SITE_OTHER): Payer: Medicare HMO | Admitting: Urology

## 2019-08-03 DIAGNOSIS — N401 Enlarged prostate with lower urinary tract symptoms: Secondary | ICD-10-CM | POA: Diagnosis not present

## 2019-08-03 DIAGNOSIS — C678 Malignant neoplasm of overlapping sites of bladder: Secondary | ICD-10-CM | POA: Diagnosis not present

## 2019-08-11 NOTE — Progress Notes (Signed)
Remote pacemaker transmission.   

## 2019-08-12 ENCOUNTER — Encounter (INDEPENDENT_AMBULATORY_CARE_PROVIDER_SITE_OTHER): Payer: Medicare HMO | Admitting: Ophthalmology

## 2019-08-12 ENCOUNTER — Other Ambulatory Visit: Payer: Self-pay

## 2019-08-12 DIAGNOSIS — H353231 Exudative age-related macular degeneration, bilateral, with active choroidal neovascularization: Secondary | ICD-10-CM | POA: Diagnosis not present

## 2019-08-12 DIAGNOSIS — H43813 Vitreous degeneration, bilateral: Secondary | ICD-10-CM | POA: Diagnosis not present

## 2019-08-13 ENCOUNTER — Telehealth (INDEPENDENT_AMBULATORY_CARE_PROVIDER_SITE_OTHER): Payer: Medicare HMO | Admitting: Internal Medicine

## 2019-08-13 VITALS — BP 148/68

## 2019-08-13 DIAGNOSIS — I495 Sick sinus syndrome: Secondary | ICD-10-CM

## 2019-08-13 DIAGNOSIS — I453 Trifascicular block: Secondary | ICD-10-CM

## 2019-08-13 NOTE — Progress Notes (Signed)
Electrophysiology TeleHealth Note   Due to national recommendations of social distancing due to [place] 19, an audio telehealth visit is felt to be most appropriate for this patient at this time.  Verbal consent was obtained by me for the telehealth visit today.  The patient does not have capability for a virtual visit.  A phone visit is therefore required today.   Date:  08/13/2019   ID:  [name], DOB [birth year], MRN [id number]  Location: patient's home  Provider location:  [facility]  Evaluation Performed: Follow-up visit  PCP:  Monico Blitz, MD   Electrophysiologist:  Dr Rayann Heman  Chief Complaint:  Pacemaker follow up  History of Present Illness:    Cristian Criger. is a 85 y.o. male who presents via telehealth conferencing today.  Since last being seen in our clinic, the patient reports doing very well.  Today, he denies symptoms of palpitations, chest pain, shortness of breath,  lower extremity edema, dizziness, presyncope, or syncope.  The patient is otherwise without complaint today.  The patient denies symptoms of fevers, chills, cough, or new SOB worrisome for COVID 19.  Past Medical History:  Diagnosis Date  . Abdominal pain   . Backache   . Bladder ulcer   . BPH (benign prostatic hyperplasia)   . Bradycardia   . Bronchitis   . Causalgia of lower limb   . Chest pain   . Constipation   . Dysphagia   . Elevated PSA   . Fatigue   . Gait difficulty   . GERD (gastroesophageal reflux disease)   . Glaucoma   . Gout   . History of kidney stones   . HOH (hard of hearing)   . Hx of gastroenteritis   . Hx of headache   . Hyperlipidemia   . Joint pain   . Macular degeneration   . Orthostatic hypotension   . Osteoarthritis   . Pancreatic cyst   . Restless leg   . Tick bite   . Toe pain   . Varicose veins of both lower extremities     Past Surgical History:  Procedure Laterality Date  . CHOLECYSTECTOMY    . PACEMAKER IMPLANT N/A  04/07/2018   St Jude Medical Assurity MRI conditional  dual-chamber pacemaker for symptomatic bradycardia by Dr Rayann Heman    Current Outpatient Medications  Medication Sig Dispense Refill  . aspirin EC 81 MG tablet Take 1 tablet (81 mg total) by mouth daily. 90 tablet 3  . dorzolamide (TRUSOPT) 2 % ophthalmic solution Place 1 drop into both eyes 2 (two) times daily.   6  . latanoprost (XALATAN) 0.005 % ophthalmic solution Place 1 drop into both eyes at bedtime.  5  . omeprazole (PRILOSEC) 40 MG capsule Take 40 mg by mouth daily.    . tamsulosin (FLOMAX) 0.4 MG CAPS capsule Take 0.4 mg by mouth daily.      No current facility-administered medications for this visit.     Allergies:   Penicillins   Social History:  The patient  reports that he has never smoked. He has quit using smokeless tobacco. He reports that he does not drink alcohol or use drugs.   Family History:  The patient's family history is significant for hypertension  ROS:  Please see the history of present illness.   All other systems are personally reviewed and negative.    Exam:    Vital Signs:  BP (!) 148/68   Well sounding, alert  and conversant, regular work of breathing   Labs/Other Tests and Data Reviewed:    Recent Labs: No results found for requested labs within last 8760 hours.   Wt Readings from Last 3 Encounters:  07/15/18 196 lb 6.4 oz (89.1 kg)  04/08/18 189 lb 9.6 oz (86 kg)  03/09/18 196 lb (88.9 kg)     Last device remote is reviewed from [place] PDF which reveals normal device function    ASSESSMENT & PLAN:    1.  Syncope with trifascicular block and sinus  Normal pacemaker function by recent remote See PaceArt report   Follow-up:  Merlin, me in 1 year    Patient Risk:  after full review of this patients clinical status, I feel that they are at moderate risk at this time.  Today, I have spent 15 minutes with the patient with telehealth technology discussing arrhythmia management .     Army Fossa, MD  08/13/2019 9:44 AM     [facility] [address] [place] [address] [phone] (office) [phone] (fax)

## 2019-08-18 DIAGNOSIS — H353290 Exudative age-related macular degeneration, unspecified eye, stage unspecified: Secondary | ICD-10-CM | POA: Diagnosis not present

## 2019-08-18 DIAGNOSIS — R609 Edema, unspecified: Secondary | ICD-10-CM | POA: Diagnosis not present

## 2019-08-18 DIAGNOSIS — Z299 Encounter for prophylactic measures, unspecified: Secondary | ICD-10-CM | POA: Diagnosis not present

## 2019-08-18 DIAGNOSIS — N419 Inflammatory disease of prostate, unspecified: Secondary | ICD-10-CM | POA: Diagnosis not present

## 2019-08-18 DIAGNOSIS — Z2821 Immunization not carried out because of patient refusal: Secondary | ICD-10-CM | POA: Diagnosis not present

## 2019-08-18 DIAGNOSIS — N183 Chronic kidney disease, stage 3 unspecified: Secondary | ICD-10-CM | POA: Diagnosis not present

## 2019-08-18 DIAGNOSIS — Z6828 Body mass index (BMI) 28.0-28.9, adult: Secondary | ICD-10-CM | POA: Diagnosis not present

## 2019-08-30 DIAGNOSIS — Z6828 Body mass index (BMI) 28.0-28.9, adult: Secondary | ICD-10-CM | POA: Diagnosis not present

## 2019-08-30 DIAGNOSIS — H6123 Impacted cerumen, bilateral: Secondary | ICD-10-CM | POA: Diagnosis not present

## 2019-08-30 DIAGNOSIS — Z299 Encounter for prophylactic measures, unspecified: Secondary | ICD-10-CM | POA: Diagnosis not present

## 2019-09-27 DIAGNOSIS — M79674 Pain in right toe(s): Secondary | ICD-10-CM | POA: Diagnosis not present

## 2019-09-27 DIAGNOSIS — S90211A Contusion of right great toe with damage to nail, initial encounter: Secondary | ICD-10-CM | POA: Diagnosis not present

## 2019-10-11 DIAGNOSIS — M79675 Pain in left toe(s): Secondary | ICD-10-CM | POA: Diagnosis not present

## 2019-10-11 DIAGNOSIS — M79674 Pain in right toe(s): Secondary | ICD-10-CM | POA: Diagnosis not present

## 2019-10-11 DIAGNOSIS — B351 Tinea unguium: Secondary | ICD-10-CM | POA: Diagnosis not present

## 2019-10-19 LAB — CUP PACEART REMOTE DEVICE CHECK
Battery Remaining Longevity: 122 mo
Battery Remaining Percentage: 95.5 %
Battery Voltage: 3.02 V
Brady Statistic AP VP Percent: 12 %
Brady Statistic AP VS Percent: 68 %
Brady Statistic AS VP Percent: 1 %
Brady Statistic AS VS Percent: 20 %
Brady Statistic RA Percent Paced: 79 %
Brady Statistic RV Percent Paced: 12 %
Date Time Interrogation Session: 20210209020012
Implantable Lead Implant Date: 20190730
Implantable Lead Implant Date: 20190730
Implantable Lead Location: 753859
Implantable Lead Location: 753860
Implantable Pulse Generator Implant Date: 20190730
Lead Channel Impedance Value: 450 Ohm
Lead Channel Impedance Value: 530 Ohm
Lead Channel Pacing Threshold Amplitude: 0.75 V
Lead Channel Pacing Threshold Amplitude: 0.75 V
Lead Channel Pacing Threshold Pulse Width: 0.5 ms
Lead Channel Pacing Threshold Pulse Width: 0.5 ms
Lead Channel Sensing Intrinsic Amplitude: 12 mV
Lead Channel Sensing Intrinsic Amplitude: 4.2 mV
Lead Channel Setting Pacing Amplitude: 1 V
Lead Channel Setting Pacing Amplitude: 1.75 V
Lead Channel Setting Pacing Pulse Width: 0.5 ms
Lead Channel Setting Sensing Sensitivity: 2 mV
Pulse Gen Model: 2272
Pulse Gen Serial Number: 9049493

## 2019-10-20 ENCOUNTER — Ambulatory Visit (INDEPENDENT_AMBULATORY_CARE_PROVIDER_SITE_OTHER): Payer: Medicare HMO | Admitting: *Deleted

## 2019-10-20 DIAGNOSIS — I495 Sick sinus syndrome: Secondary | ICD-10-CM | POA: Diagnosis not present

## 2019-10-20 NOTE — Progress Notes (Signed)
PPM Remote  

## 2019-11-04 ENCOUNTER — Encounter (INDEPENDENT_AMBULATORY_CARE_PROVIDER_SITE_OTHER): Payer: Medicare HMO | Admitting: Ophthalmology

## 2019-11-04 ENCOUNTER — Other Ambulatory Visit: Payer: Self-pay

## 2019-11-04 DIAGNOSIS — H43813 Vitreous degeneration, bilateral: Secondary | ICD-10-CM

## 2019-11-04 DIAGNOSIS — H353231 Exudative age-related macular degeneration, bilateral, with active choroidal neovascularization: Secondary | ICD-10-CM | POA: Diagnosis not present

## 2019-11-22 DIAGNOSIS — H40012 Open angle with borderline findings, low risk, left eye: Secondary | ICD-10-CM | POA: Diagnosis not present

## 2019-11-22 DIAGNOSIS — H401113 Primary open-angle glaucoma, right eye, severe stage: Secondary | ICD-10-CM | POA: Diagnosis not present

## 2019-11-22 DIAGNOSIS — H353231 Exudative age-related macular degeneration, bilateral, with active choroidal neovascularization: Secondary | ICD-10-CM | POA: Diagnosis not present

## 2019-11-22 DIAGNOSIS — Z961 Presence of intraocular lens: Secondary | ICD-10-CM | POA: Diagnosis not present

## 2019-11-29 DIAGNOSIS — R252 Cramp and spasm: Secondary | ICD-10-CM | POA: Diagnosis not present

## 2019-11-29 DIAGNOSIS — Z6828 Body mass index (BMI) 28.0-28.9, adult: Secondary | ICD-10-CM | POA: Diagnosis not present

## 2019-11-29 DIAGNOSIS — H353290 Exudative age-related macular degeneration, unspecified eye, stage unspecified: Secondary | ICD-10-CM | POA: Diagnosis not present

## 2019-11-29 DIAGNOSIS — Z299 Encounter for prophylactic measures, unspecified: Secondary | ICD-10-CM | POA: Diagnosis not present

## 2019-11-29 DIAGNOSIS — N183 Chronic kidney disease, stage 3 unspecified: Secondary | ICD-10-CM | POA: Diagnosis not present

## 2019-11-29 DIAGNOSIS — E7800 Pure hypercholesterolemia, unspecified: Secondary | ICD-10-CM | POA: Diagnosis not present

## 2019-12-27 DIAGNOSIS — M79675 Pain in left toe(s): Secondary | ICD-10-CM | POA: Diagnosis not present

## 2019-12-27 DIAGNOSIS — M79674 Pain in right toe(s): Secondary | ICD-10-CM | POA: Diagnosis not present

## 2019-12-27 DIAGNOSIS — B351 Tinea unguium: Secondary | ICD-10-CM | POA: Diagnosis not present

## 2020-01-11 DIAGNOSIS — G8929 Other chronic pain: Secondary | ICD-10-CM | POA: Diagnosis not present

## 2020-01-11 DIAGNOSIS — N529 Male erectile dysfunction, unspecified: Secondary | ICD-10-CM | POA: Diagnosis not present

## 2020-01-11 DIAGNOSIS — N3941 Urge incontinence: Secondary | ICD-10-CM | POA: Diagnosis not present

## 2020-01-11 DIAGNOSIS — I495 Sick sinus syndrome: Secondary | ICD-10-CM | POA: Diagnosis not present

## 2020-01-11 DIAGNOSIS — N400 Enlarged prostate without lower urinary tract symptoms: Secondary | ICD-10-CM | POA: Diagnosis not present

## 2020-01-11 DIAGNOSIS — Z008 Encounter for other general examination: Secondary | ICD-10-CM | POA: Diagnosis not present

## 2020-01-11 DIAGNOSIS — M1990 Unspecified osteoarthritis, unspecified site: Secondary | ICD-10-CM | POA: Diagnosis not present

## 2020-01-11 DIAGNOSIS — K219 Gastro-esophageal reflux disease without esophagitis: Secondary | ICD-10-CM | POA: Diagnosis not present

## 2020-01-11 DIAGNOSIS — Z8551 Personal history of malignant neoplasm of bladder: Secondary | ICD-10-CM | POA: Diagnosis not present

## 2020-01-11 DIAGNOSIS — H409 Unspecified glaucoma: Secondary | ICD-10-CM | POA: Diagnosis not present

## 2020-01-11 DIAGNOSIS — I499 Cardiac arrhythmia, unspecified: Secondary | ICD-10-CM | POA: Diagnosis not present

## 2020-01-18 LAB — CUP PACEART REMOTE DEVICE CHECK
Battery Remaining Longevity: 125 mo
Battery Remaining Percentage: 95.5 %
Battery Voltage: 3.02 V
Brady Statistic AP VP Percent: 10 %
Brady Statistic AP VS Percent: 70 %
Brady Statistic AS VP Percent: 1 %
Brady Statistic AS VS Percent: 20 %
Brady Statistic RA Percent Paced: 80 %
Brady Statistic RV Percent Paced: 10 %
Date Time Interrogation Session: 20210511020012
Implantable Lead Implant Date: 20190730
Implantable Lead Implant Date: 20190730
Implantable Lead Location: 753859
Implantable Lead Location: 753860
Implantable Pulse Generator Implant Date: 20190730
Lead Channel Impedance Value: 450 Ohm
Lead Channel Impedance Value: 490 Ohm
Lead Channel Pacing Threshold Amplitude: 0.75 V
Lead Channel Pacing Threshold Amplitude: 0.75 V
Lead Channel Pacing Threshold Pulse Width: 0.5 ms
Lead Channel Pacing Threshold Pulse Width: 0.5 ms
Lead Channel Sensing Intrinsic Amplitude: 12 mV
Lead Channel Sensing Intrinsic Amplitude: 3.9 mV
Lead Channel Setting Pacing Amplitude: 1 V
Lead Channel Setting Pacing Amplitude: 1.75 V
Lead Channel Setting Pacing Pulse Width: 0.5 ms
Lead Channel Setting Sensing Sensitivity: 2 mV
Pulse Gen Model: 2272
Pulse Gen Serial Number: 9049493

## 2020-01-19 ENCOUNTER — Ambulatory Visit (INDEPENDENT_AMBULATORY_CARE_PROVIDER_SITE_OTHER): Payer: Medicare HMO | Admitting: *Deleted

## 2020-01-19 DIAGNOSIS — I495 Sick sinus syndrome: Secondary | ICD-10-CM

## 2020-01-20 NOTE — Progress Notes (Signed)
Remote pacemaker transmission.   

## 2020-01-27 ENCOUNTER — Encounter (INDEPENDENT_AMBULATORY_CARE_PROVIDER_SITE_OTHER): Payer: Medicare HMO | Admitting: Ophthalmology

## 2020-01-27 ENCOUNTER — Other Ambulatory Visit: Payer: Self-pay

## 2020-01-27 DIAGNOSIS — H43813 Vitreous degeneration, bilateral: Secondary | ICD-10-CM | POA: Diagnosis not present

## 2020-01-27 DIAGNOSIS — H353231 Exudative age-related macular degeneration, bilateral, with active choroidal neovascularization: Secondary | ICD-10-CM

## 2020-02-23 DIAGNOSIS — H401113 Primary open-angle glaucoma, right eye, severe stage: Secondary | ICD-10-CM | POA: Diagnosis not present

## 2020-02-23 DIAGNOSIS — H353231 Exudative age-related macular degeneration, bilateral, with active choroidal neovascularization: Secondary | ICD-10-CM | POA: Diagnosis not present

## 2020-02-23 DIAGNOSIS — H40012 Open angle with borderline findings, low risk, left eye: Secondary | ICD-10-CM | POA: Diagnosis not present

## 2020-02-23 DIAGNOSIS — Z961 Presence of intraocular lens: Secondary | ICD-10-CM | POA: Diagnosis not present

## 2020-03-06 DIAGNOSIS — M79674 Pain in right toe(s): Secondary | ICD-10-CM | POA: Diagnosis not present

## 2020-03-06 DIAGNOSIS — B351 Tinea unguium: Secondary | ICD-10-CM | POA: Diagnosis not present

## 2020-03-06 DIAGNOSIS — M79675 Pain in left toe(s): Secondary | ICD-10-CM | POA: Diagnosis not present

## 2020-03-08 DIAGNOSIS — M5136 Other intervertebral disc degeneration, lumbar region: Secondary | ICD-10-CM | POA: Diagnosis not present

## 2020-03-08 DIAGNOSIS — I700 Atherosclerosis of aorta: Secondary | ICD-10-CM | POA: Diagnosis not present

## 2020-03-08 DIAGNOSIS — N183 Chronic kidney disease, stage 3 unspecified: Secondary | ICD-10-CM | POA: Diagnosis not present

## 2020-03-08 DIAGNOSIS — E7800 Pure hypercholesterolemia, unspecified: Secondary | ICD-10-CM | POA: Diagnosis not present

## 2020-03-08 DIAGNOSIS — Z299 Encounter for prophylactic measures, unspecified: Secondary | ICD-10-CM | POA: Diagnosis not present

## 2020-03-08 DIAGNOSIS — Z6828 Body mass index (BMI) 28.0-28.9, adult: Secondary | ICD-10-CM | POA: Diagnosis not present

## 2020-03-08 DIAGNOSIS — M545 Low back pain: Secondary | ICD-10-CM | POA: Diagnosis not present

## 2020-03-21 DIAGNOSIS — M545 Low back pain: Secondary | ICD-10-CM | POA: Diagnosis not present

## 2020-03-21 DIAGNOSIS — K219 Gastro-esophageal reflux disease without esophagitis: Secondary | ICD-10-CM | POA: Diagnosis not present

## 2020-03-21 DIAGNOSIS — Z299 Encounter for prophylactic measures, unspecified: Secondary | ICD-10-CM | POA: Diagnosis not present

## 2020-03-21 DIAGNOSIS — R252 Cramp and spasm: Secondary | ICD-10-CM | POA: Diagnosis not present

## 2020-03-21 DIAGNOSIS — H353290 Exudative age-related macular degeneration, unspecified eye, stage unspecified: Secondary | ICD-10-CM | POA: Diagnosis not present

## 2020-04-17 ENCOUNTER — Encounter (INDEPENDENT_AMBULATORY_CARE_PROVIDER_SITE_OTHER): Payer: Medicare HMO | Admitting: Ophthalmology

## 2020-04-19 ENCOUNTER — Ambulatory Visit (INDEPENDENT_AMBULATORY_CARE_PROVIDER_SITE_OTHER): Payer: Medicare HMO | Admitting: *Deleted

## 2020-04-19 DIAGNOSIS — I495 Sick sinus syndrome: Secondary | ICD-10-CM | POA: Diagnosis not present

## 2020-04-19 LAB — CUP PACEART REMOTE DEVICE CHECK
Battery Remaining Longevity: 123 mo
Battery Remaining Percentage: 95.5 %
Battery Voltage: 3.01 V
Brady Statistic AP VP Percent: 12 %
Brady Statistic AP VS Percent: 69 %
Brady Statistic AS VP Percent: 1 %
Brady Statistic AS VS Percent: 19 %
Brady Statistic RA Percent Paced: 80 %
Brady Statistic RV Percent Paced: 12 %
Date Time Interrogation Session: 20210810020023
Implantable Lead Implant Date: 20190730
Implantable Lead Implant Date: 20190730
Implantable Lead Location: 753859
Implantable Lead Location: 753860
Implantable Pulse Generator Implant Date: 20190730
Lead Channel Impedance Value: 450 Ohm
Lead Channel Impedance Value: 490 Ohm
Lead Channel Pacing Threshold Amplitude: 0.75 V
Lead Channel Pacing Threshold Amplitude: 0.875 V
Lead Channel Pacing Threshold Pulse Width: 0.5 ms
Lead Channel Pacing Threshold Pulse Width: 0.5 ms
Lead Channel Sensing Intrinsic Amplitude: 12 mV
Lead Channel Sensing Intrinsic Amplitude: 3.8 mV
Lead Channel Setting Pacing Amplitude: 1.125
Lead Channel Setting Pacing Amplitude: 1.75 V
Lead Channel Setting Pacing Pulse Width: 0.5 ms
Lead Channel Setting Sensing Sensitivity: 2 mV
Pulse Gen Model: 2272
Pulse Gen Serial Number: 9049493

## 2020-04-21 NOTE — Progress Notes (Signed)
Remote pacemaker transmission.   

## 2020-04-24 DIAGNOSIS — R2681 Unsteadiness on feet: Secondary | ICD-10-CM | POA: Diagnosis not present

## 2020-04-24 DIAGNOSIS — N400 Enlarged prostate without lower urinary tract symptoms: Secondary | ICD-10-CM | POA: Diagnosis not present

## 2020-04-24 DIAGNOSIS — Z299 Encounter for prophylactic measures, unspecified: Secondary | ICD-10-CM | POA: Diagnosis not present

## 2020-04-24 DIAGNOSIS — Z8551 Personal history of malignant neoplasm of bladder: Secondary | ICD-10-CM | POA: Diagnosis not present

## 2020-04-24 DIAGNOSIS — I739 Peripheral vascular disease, unspecified: Secondary | ICD-10-CM | POA: Diagnosis not present

## 2020-05-04 ENCOUNTER — Encounter (INDEPENDENT_AMBULATORY_CARE_PROVIDER_SITE_OTHER): Payer: Medicare HMO | Admitting: Ophthalmology

## 2020-05-10 ENCOUNTER — Other Ambulatory Visit: Payer: Self-pay

## 2020-05-10 ENCOUNTER — Encounter (INDEPENDENT_AMBULATORY_CARE_PROVIDER_SITE_OTHER): Payer: Medicare HMO | Admitting: Ophthalmology

## 2020-05-10 DIAGNOSIS — H353213 Exudative age-related macular degeneration, right eye, with inactive scar: Secondary | ICD-10-CM | POA: Diagnosis not present

## 2020-05-10 DIAGNOSIS — H43813 Vitreous degeneration, bilateral: Secondary | ICD-10-CM

## 2020-05-11 DIAGNOSIS — M109 Gout, unspecified: Secondary | ICD-10-CM | POA: Diagnosis not present

## 2020-05-11 DIAGNOSIS — R2681 Unsteadiness on feet: Secondary | ICD-10-CM | POA: Diagnosis not present

## 2020-05-11 DIAGNOSIS — N183 Chronic kidney disease, stage 3 unspecified: Secondary | ICD-10-CM | POA: Diagnosis not present

## 2020-05-11 DIAGNOSIS — M722 Plantar fascial fibromatosis: Secondary | ICD-10-CM | POA: Diagnosis not present

## 2020-05-11 DIAGNOSIS — I951 Orthostatic hypotension: Secondary | ICD-10-CM | POA: Diagnosis not present

## 2020-05-11 DIAGNOSIS — I739 Peripheral vascular disease, unspecified: Secondary | ICD-10-CM | POA: Diagnosis not present

## 2020-05-11 DIAGNOSIS — I8393 Asymptomatic varicose veins of bilateral lower extremities: Secondary | ICD-10-CM | POA: Diagnosis not present

## 2020-05-11 DIAGNOSIS — N400 Enlarged prostate without lower urinary tract symptoms: Secondary | ICD-10-CM | POA: Diagnosis not present

## 2020-05-11 DIAGNOSIS — M1710 Unilateral primary osteoarthritis, unspecified knee: Secondary | ICD-10-CM | POA: Diagnosis not present

## 2020-05-11 DIAGNOSIS — R252 Cramp and spasm: Secondary | ICD-10-CM | POA: Diagnosis not present

## 2020-05-12 DIAGNOSIS — I8393 Asymptomatic varicose veins of bilateral lower extremities: Secondary | ICD-10-CM | POA: Diagnosis not present

## 2020-05-12 DIAGNOSIS — N183 Chronic kidney disease, stage 3 unspecified: Secondary | ICD-10-CM | POA: Diagnosis not present

## 2020-05-12 DIAGNOSIS — M109 Gout, unspecified: Secondary | ICD-10-CM | POA: Diagnosis not present

## 2020-05-12 DIAGNOSIS — R252 Cramp and spasm: Secondary | ICD-10-CM | POA: Diagnosis not present

## 2020-05-12 DIAGNOSIS — I739 Peripheral vascular disease, unspecified: Secondary | ICD-10-CM | POA: Diagnosis not present

## 2020-05-12 DIAGNOSIS — M1710 Unilateral primary osteoarthritis, unspecified knee: Secondary | ICD-10-CM | POA: Diagnosis not present

## 2020-05-12 DIAGNOSIS — I951 Orthostatic hypotension: Secondary | ICD-10-CM | POA: Diagnosis not present

## 2020-05-12 DIAGNOSIS — M722 Plantar fascial fibromatosis: Secondary | ICD-10-CM | POA: Diagnosis not present

## 2020-05-12 DIAGNOSIS — N400 Enlarged prostate without lower urinary tract symptoms: Secondary | ICD-10-CM | POA: Diagnosis not present

## 2020-05-12 DIAGNOSIS — R2681 Unsteadiness on feet: Secondary | ICD-10-CM | POA: Diagnosis not present

## 2020-05-17 DIAGNOSIS — M1710 Unilateral primary osteoarthritis, unspecified knee: Secondary | ICD-10-CM | POA: Diagnosis not present

## 2020-05-17 DIAGNOSIS — I739 Peripheral vascular disease, unspecified: Secondary | ICD-10-CM | POA: Diagnosis not present

## 2020-05-17 DIAGNOSIS — N183 Chronic kidney disease, stage 3 unspecified: Secondary | ICD-10-CM | POA: Diagnosis not present

## 2020-05-17 DIAGNOSIS — I8393 Asymptomatic varicose veins of bilateral lower extremities: Secondary | ICD-10-CM | POA: Diagnosis not present

## 2020-05-17 DIAGNOSIS — R2681 Unsteadiness on feet: Secondary | ICD-10-CM | POA: Diagnosis not present

## 2020-05-17 DIAGNOSIS — N400 Enlarged prostate without lower urinary tract symptoms: Secondary | ICD-10-CM | POA: Diagnosis not present

## 2020-05-17 DIAGNOSIS — M722 Plantar fascial fibromatosis: Secondary | ICD-10-CM | POA: Diagnosis not present

## 2020-05-17 DIAGNOSIS — I951 Orthostatic hypotension: Secondary | ICD-10-CM | POA: Diagnosis not present

## 2020-05-17 DIAGNOSIS — R252 Cramp and spasm: Secondary | ICD-10-CM | POA: Diagnosis not present

## 2020-05-17 DIAGNOSIS — M109 Gout, unspecified: Secondary | ICD-10-CM | POA: Diagnosis not present

## 2020-05-20 DIAGNOSIS — R252 Cramp and spasm: Secondary | ICD-10-CM | POA: Diagnosis not present

## 2020-05-20 DIAGNOSIS — R2681 Unsteadiness on feet: Secondary | ICD-10-CM | POA: Diagnosis not present

## 2020-05-20 DIAGNOSIS — I951 Orthostatic hypotension: Secondary | ICD-10-CM | POA: Diagnosis not present

## 2020-05-20 DIAGNOSIS — I8393 Asymptomatic varicose veins of bilateral lower extremities: Secondary | ICD-10-CM | POA: Diagnosis not present

## 2020-05-20 DIAGNOSIS — I739 Peripheral vascular disease, unspecified: Secondary | ICD-10-CM | POA: Diagnosis not present

## 2020-05-20 DIAGNOSIS — N183 Chronic kidney disease, stage 3 unspecified: Secondary | ICD-10-CM | POA: Diagnosis not present

## 2020-05-20 DIAGNOSIS — M109 Gout, unspecified: Secondary | ICD-10-CM | POA: Diagnosis not present

## 2020-05-20 DIAGNOSIS — M722 Plantar fascial fibromatosis: Secondary | ICD-10-CM | POA: Diagnosis not present

## 2020-05-20 DIAGNOSIS — N400 Enlarged prostate without lower urinary tract symptoms: Secondary | ICD-10-CM | POA: Diagnosis not present

## 2020-05-20 DIAGNOSIS — M1710 Unilateral primary osteoarthritis, unspecified knee: Secondary | ICD-10-CM | POA: Diagnosis not present

## 2020-05-22 DIAGNOSIS — Z299 Encounter for prophylactic measures, unspecified: Secondary | ICD-10-CM | POA: Diagnosis not present

## 2020-05-22 DIAGNOSIS — C679 Malignant neoplasm of bladder, unspecified: Secondary | ICD-10-CM | POA: Diagnosis not present

## 2020-05-22 DIAGNOSIS — Z1339 Encounter for screening examination for other mental health and behavioral disorders: Secondary | ICD-10-CM | POA: Diagnosis not present

## 2020-05-22 DIAGNOSIS — R2681 Unsteadiness on feet: Secondary | ICD-10-CM | POA: Diagnosis not present

## 2020-05-22 DIAGNOSIS — Z6827 Body mass index (BMI) 27.0-27.9, adult: Secondary | ICD-10-CM | POA: Diagnosis not present

## 2020-05-22 DIAGNOSIS — R5383 Other fatigue: Secondary | ICD-10-CM | POA: Diagnosis not present

## 2020-05-22 DIAGNOSIS — Z7189 Other specified counseling: Secondary | ICD-10-CM | POA: Diagnosis not present

## 2020-05-22 DIAGNOSIS — Z79899 Other long term (current) drug therapy: Secondary | ICD-10-CM | POA: Diagnosis not present

## 2020-05-22 DIAGNOSIS — M79675 Pain in left toe(s): Secondary | ICD-10-CM | POA: Diagnosis not present

## 2020-05-22 DIAGNOSIS — M79674 Pain in right toe(s): Secondary | ICD-10-CM | POA: Diagnosis not present

## 2020-05-22 DIAGNOSIS — N400 Enlarged prostate without lower urinary tract symptoms: Secondary | ICD-10-CM | POA: Diagnosis not present

## 2020-05-22 DIAGNOSIS — Z0000 Encounter for general adult medical examination without abnormal findings: Secondary | ICD-10-CM | POA: Diagnosis not present

## 2020-05-22 DIAGNOSIS — B351 Tinea unguium: Secondary | ICD-10-CM | POA: Diagnosis not present

## 2020-05-22 DIAGNOSIS — E7800 Pure hypercholesterolemia, unspecified: Secondary | ICD-10-CM | POA: Diagnosis not present

## 2020-05-22 DIAGNOSIS — Z125 Encounter for screening for malignant neoplasm of prostate: Secondary | ICD-10-CM | POA: Diagnosis not present

## 2020-05-22 DIAGNOSIS — Z1331 Encounter for screening for depression: Secondary | ICD-10-CM | POA: Diagnosis not present

## 2020-05-24 DIAGNOSIS — R2681 Unsteadiness on feet: Secondary | ICD-10-CM | POA: Diagnosis not present

## 2020-05-24 DIAGNOSIS — M722 Plantar fascial fibromatosis: Secondary | ICD-10-CM | POA: Diagnosis not present

## 2020-05-24 DIAGNOSIS — I739 Peripheral vascular disease, unspecified: Secondary | ICD-10-CM | POA: Diagnosis not present

## 2020-05-24 DIAGNOSIS — M109 Gout, unspecified: Secondary | ICD-10-CM | POA: Diagnosis not present

## 2020-05-24 DIAGNOSIS — R252 Cramp and spasm: Secondary | ICD-10-CM | POA: Diagnosis not present

## 2020-05-24 DIAGNOSIS — I8393 Asymptomatic varicose veins of bilateral lower extremities: Secondary | ICD-10-CM | POA: Diagnosis not present

## 2020-05-24 DIAGNOSIS — I951 Orthostatic hypotension: Secondary | ICD-10-CM | POA: Diagnosis not present

## 2020-05-24 DIAGNOSIS — N400 Enlarged prostate without lower urinary tract symptoms: Secondary | ICD-10-CM | POA: Diagnosis not present

## 2020-05-24 DIAGNOSIS — N183 Chronic kidney disease, stage 3 unspecified: Secondary | ICD-10-CM | POA: Diagnosis not present

## 2020-05-24 DIAGNOSIS — M1710 Unilateral primary osteoarthritis, unspecified knee: Secondary | ICD-10-CM | POA: Diagnosis not present

## 2020-05-25 DIAGNOSIS — M109 Gout, unspecified: Secondary | ICD-10-CM | POA: Diagnosis not present

## 2020-05-25 DIAGNOSIS — R252 Cramp and spasm: Secondary | ICD-10-CM | POA: Diagnosis not present

## 2020-05-25 DIAGNOSIS — N183 Chronic kidney disease, stage 3 unspecified: Secondary | ICD-10-CM | POA: Diagnosis not present

## 2020-05-25 DIAGNOSIS — N400 Enlarged prostate without lower urinary tract symptoms: Secondary | ICD-10-CM | POA: Diagnosis not present

## 2020-05-25 DIAGNOSIS — I739 Peripheral vascular disease, unspecified: Secondary | ICD-10-CM | POA: Diagnosis not present

## 2020-05-25 DIAGNOSIS — M722 Plantar fascial fibromatosis: Secondary | ICD-10-CM | POA: Diagnosis not present

## 2020-05-25 DIAGNOSIS — I951 Orthostatic hypotension: Secondary | ICD-10-CM | POA: Diagnosis not present

## 2020-05-25 DIAGNOSIS — R2681 Unsteadiness on feet: Secondary | ICD-10-CM | POA: Diagnosis not present

## 2020-05-25 DIAGNOSIS — I8393 Asymptomatic varicose veins of bilateral lower extremities: Secondary | ICD-10-CM | POA: Diagnosis not present

## 2020-05-25 DIAGNOSIS — M1710 Unilateral primary osteoarthritis, unspecified knee: Secondary | ICD-10-CM | POA: Diagnosis not present

## 2020-05-29 DIAGNOSIS — N183 Chronic kidney disease, stage 3 unspecified: Secondary | ICD-10-CM | POA: Diagnosis not present

## 2020-05-29 DIAGNOSIS — M722 Plantar fascial fibromatosis: Secondary | ICD-10-CM | POA: Diagnosis not present

## 2020-05-29 DIAGNOSIS — R252 Cramp and spasm: Secondary | ICD-10-CM | POA: Diagnosis not present

## 2020-05-29 DIAGNOSIS — R2681 Unsteadiness on feet: Secondary | ICD-10-CM | POA: Diagnosis not present

## 2020-05-29 DIAGNOSIS — I8393 Asymptomatic varicose veins of bilateral lower extremities: Secondary | ICD-10-CM | POA: Diagnosis not present

## 2020-05-29 DIAGNOSIS — I739 Peripheral vascular disease, unspecified: Secondary | ICD-10-CM | POA: Diagnosis not present

## 2020-05-29 DIAGNOSIS — M1710 Unilateral primary osteoarthritis, unspecified knee: Secondary | ICD-10-CM | POA: Diagnosis not present

## 2020-05-29 DIAGNOSIS — M109 Gout, unspecified: Secondary | ICD-10-CM | POA: Diagnosis not present

## 2020-05-29 DIAGNOSIS — I951 Orthostatic hypotension: Secondary | ICD-10-CM | POA: Diagnosis not present

## 2020-05-29 DIAGNOSIS — N400 Enlarged prostate without lower urinary tract symptoms: Secondary | ICD-10-CM | POA: Diagnosis not present

## 2020-05-31 DIAGNOSIS — I739 Peripheral vascular disease, unspecified: Secondary | ICD-10-CM | POA: Diagnosis not present

## 2020-06-02 DIAGNOSIS — M109 Gout, unspecified: Secondary | ICD-10-CM | POA: Diagnosis not present

## 2020-06-02 DIAGNOSIS — M722 Plantar fascial fibromatosis: Secondary | ICD-10-CM | POA: Diagnosis not present

## 2020-06-02 DIAGNOSIS — N400 Enlarged prostate without lower urinary tract symptoms: Secondary | ICD-10-CM | POA: Diagnosis not present

## 2020-06-02 DIAGNOSIS — M1710 Unilateral primary osteoarthritis, unspecified knee: Secondary | ICD-10-CM | POA: Diagnosis not present

## 2020-06-02 DIAGNOSIS — R2681 Unsteadiness on feet: Secondary | ICD-10-CM | POA: Diagnosis not present

## 2020-06-02 DIAGNOSIS — R252 Cramp and spasm: Secondary | ICD-10-CM | POA: Diagnosis not present

## 2020-06-02 DIAGNOSIS — I8393 Asymptomatic varicose veins of bilateral lower extremities: Secondary | ICD-10-CM | POA: Diagnosis not present

## 2020-06-02 DIAGNOSIS — I951 Orthostatic hypotension: Secondary | ICD-10-CM | POA: Diagnosis not present

## 2020-06-02 DIAGNOSIS — I739 Peripheral vascular disease, unspecified: Secondary | ICD-10-CM | POA: Diagnosis not present

## 2020-06-02 DIAGNOSIS — N183 Chronic kidney disease, stage 3 unspecified: Secondary | ICD-10-CM | POA: Diagnosis not present

## 2020-06-27 DIAGNOSIS — H401113 Primary open-angle glaucoma, right eye, severe stage: Secondary | ICD-10-CM | POA: Diagnosis not present

## 2020-06-27 DIAGNOSIS — H40012 Open angle with borderline findings, low risk, left eye: Secondary | ICD-10-CM | POA: Diagnosis not present

## 2020-06-27 DIAGNOSIS — H353231 Exudative age-related macular degeneration, bilateral, with active choroidal neovascularization: Secondary | ICD-10-CM | POA: Diagnosis not present

## 2020-06-27 DIAGNOSIS — Z961 Presence of intraocular lens: Secondary | ICD-10-CM | POA: Diagnosis not present

## 2020-07-19 ENCOUNTER — Ambulatory Visit (INDEPENDENT_AMBULATORY_CARE_PROVIDER_SITE_OTHER): Payer: Medicare HMO

## 2020-07-19 DIAGNOSIS — R001 Bradycardia, unspecified: Secondary | ICD-10-CM | POA: Diagnosis not present

## 2020-07-19 LAB — CUP PACEART REMOTE DEVICE CHECK
Battery Remaining Longevity: 122 mo
Battery Remaining Percentage: 95.5 %
Battery Voltage: 3.01 V
Brady Statistic AP VP Percent: 13 %
Brady Statistic AP VS Percent: 67 %
Brady Statistic AS VP Percent: 1 %
Brady Statistic AS VS Percent: 20 %
Brady Statistic RA Percent Paced: 80 %
Brady Statistic RV Percent Paced: 13 %
Date Time Interrogation Session: 20211109020025
Implantable Lead Implant Date: 20190730
Implantable Lead Implant Date: 20190730
Implantable Lead Location: 753859
Implantable Lead Location: 753860
Implantable Pulse Generator Implant Date: 20190730
Lead Channel Impedance Value: 450 Ohm
Lead Channel Impedance Value: 480 Ohm
Lead Channel Pacing Threshold Amplitude: 0.75 V
Lead Channel Pacing Threshold Amplitude: 1 V
Lead Channel Pacing Threshold Pulse Width: 0.5 ms
Lead Channel Pacing Threshold Pulse Width: 0.5 ms
Lead Channel Sensing Intrinsic Amplitude: 12 mV
Lead Channel Sensing Intrinsic Amplitude: 4.1 mV
Lead Channel Setting Pacing Amplitude: 1.25 V
Lead Channel Setting Pacing Amplitude: 1.75 V
Lead Channel Setting Pacing Pulse Width: 0.5 ms
Lead Channel Setting Sensing Sensitivity: 2 mV
Pulse Gen Model: 2272
Pulse Gen Serial Number: 9049493

## 2020-07-20 NOTE — Progress Notes (Signed)
Remote pacemaker transmission.   

## 2020-07-25 ENCOUNTER — Ambulatory Visit: Payer: Medicare HMO | Admitting: Urology

## 2020-07-25 ENCOUNTER — Ambulatory Visit (INDEPENDENT_AMBULATORY_CARE_PROVIDER_SITE_OTHER): Payer: Medicare HMO | Admitting: Urology

## 2020-07-25 ENCOUNTER — Encounter: Payer: Self-pay | Admitting: Urology

## 2020-07-25 ENCOUNTER — Other Ambulatory Visit: Payer: Self-pay

## 2020-07-25 VITALS — BP 178/71 | HR 86 | Temp 97.6°F | Wt 200.0 lb

## 2020-07-25 DIAGNOSIS — N401 Enlarged prostate with lower urinary tract symptoms: Secondary | ICD-10-CM

## 2020-07-25 DIAGNOSIS — N138 Other obstructive and reflux uropathy: Secondary | ICD-10-CM | POA: Diagnosis not present

## 2020-07-25 LAB — MICROSCOPIC EXAMINATION
Epithelial Cells (non renal): NONE SEEN /hpf (ref 0–10)
Renal Epithel, UA: NONE SEEN /hpf
WBC, UA: >30 /hpf — AB (ref 0–5)

## 2020-07-25 LAB — URINALYSIS, ROUTINE W REFLEX MICROSCOPIC
Bilirubin, UA: NEGATIVE
Glucose, UA: NEGATIVE
Ketones, UA: NEGATIVE
Nitrite, UA: NEGATIVE
Protein,UA: NEGATIVE
Specific Gravity, UA: 1.02 (ref 1.005–1.030)
Urobilinogen, Ur: 0.2 mg/dL (ref 0.2–1.0)
pH, UA: 6 (ref 5.0–7.5)

## 2020-07-25 LAB — BLADDER SCAN AMB NON-IMAGING: Scan Result: 113

## 2020-07-25 MED ORDER — FINASTERIDE 5 MG PO TABS: 5.0000 mg | ORAL_TABLET | Freq: Every day | ORAL | 3 refills | Status: AC

## 2020-07-25 NOTE — Progress Notes (Signed)
H&P  Chief Complaint: BPH & H/O Bladder Cancer  History of Present Illness:   11.16.2021: PSA 5.4 Pt experiencing significant urinary symptoms. He was prescribed alfuzosin which resolved his urinary symptoms but caused significant dizziness. He discontinued his alfuzosin regimen on 09.06.2021 and reports that his LUTS have worsened considerably since. He does note that he drinks a significant amount of sweet tea.  IPSS Questionnaire (AUA-7): Over the past month.   1)  How often have you had a sensation of not emptying your bladder completely after you finish urinating?  5 - Almost always  2)  How often have you had to urinate again less than two hours after you finished urinating? 5 - Almost always  3)  How often have you found you stopped and started again several times when you urinated?  5 - Almost always  4) How difficult have you found it to postpone urination?  5 - Almost always  5) How often have you had a weak urinary stream?  5 - Almost always  6) How often have you had to push or strain to begin urination?  0 - Not at all  7) How many times did you most typically get up to urinate from the time you went to bed until the time you got up in the morning?  4 - 4 times  Total score:  0-7 mildly symptomatic   8-19 moderately symptomatic   20-35 severely symptomatic  QoL score: 6  (below copied from [facility] records):  CC: I have bladder cancer that has been treated.  HPI: Jonathon Butler is a 86 year-old male established patient who is here for follow-up of bladder cancer treatment.  His bladder cancer was superficial and limitied to the bladder lining. His bladder cancer was not muscle invasive. He had treatment with the following intravesical agents: bcg. Patient denies mitomycin, interferon, and adriamycin.   He did have a TURBT. He did not have a radical cystectomy for the treatment of his bladder cancer. He did not have chemotherapy for treamtent of his bladder cancer. He did not have  radiation to treat his bladder cancer. He is not here for an intravesical treatment today.   He has not had blood in his urine recently. He is not having pain in new locations. He does have a good appetite. He has not recently had unwanted weight loss.   His last cysto was 10/28/2017.   H/O multiple TURBTs in the 61s with multiple recurrences and eventual BCG induction. No recurrences for many years with last cystoscopy Dec 2017 by Dr Exie Parody.   08/03/2019: Last cysto 10/2017. No hematuria. no worsening LUTS   CC: BPH New Patient  HPI: Patient states he has not had urinary retention in the past. The patient states if he were to spend the rest of his life with his current urinary condition, he would be pleased. He denies any other associated symptoms. Patient is currently treated with tamsulosin for his symptoms.   He is on tamsulosin with fairly stable voiding symptoms.   2.19.2019: Initial OV for BPH. On flomax.   IPSS 17  QOL score 2   10.20.2020: He continues on tamsulosin. He is having worsening urinary sx's since last visit (IPSS 17 --> 24), but he did just recover from back to back kidney/bladder infections.   08/03/2019: He stopped flomax since last visit. Nocturia improved to 2-3x off flomax. He has a new feeling of incomplete emptying.    Past Medical History:  Diagnosis Date  .  Abdominal pain   . Backache   . Bladder ulcer   . BPH (benign prostatic hyperplasia)   . Bradycardia   . Bronchitis   . Causalgia of lower limb   . Chest pain   . Constipation   . Dysphagia   . Elevated PSA   . Fatigue   . Gait difficulty   . GERD (gastroesophageal reflux disease)   . Glaucoma   . Gout   . History of kidney stones   . HOH (hard of hearing)   . Hx of gastroenteritis   . Hx of headache   . Hyperlipidemia   . Joint pain   . Macular degeneration   . Orthostatic hypotension   . Osteoarthritis   . Pancreatic cyst   . Restless leg   . Tick bite   . Toe pain   . Varicose  veins of both lower extremities     Past Surgical History:  Procedure Laterality Date  . CHOLECYSTECTOMY    . PACEMAKER IMPLANT N/A 04/07/2018   St Jude Medical Assurity MRI conditional  dual-chamber pacemaker for symptomatic bradycardia by Dr Rayann Heman    Home Medications:  Allergies as of 07/25/2020      Reactions   Penicillins    Has patient had a PCN reaction causing immediate rash, facial/tongue/throat swelling, SOB or lightheadedness with hypotension: YES Has patient had a PCN reaction causing severe rash involving mucus membranes or skin necrosis: NO Has patient had a PCN reaction that required hospitalization: NO Has patient had a PCN reaction occurring within the last 10 years: NO If all of the above answers are "NO", then may proceed with Cephalosporin use.      Medication List       Accurate as of July 25, 2020 11:01 AM. If you have any questions, ask your nurse or doctor.        aspirin EC 81 MG tablet Take 1 tablet (81 mg total) by mouth daily.   dorzolamide 2 % ophthalmic solution Commonly known as: TRUSOPT Place 1 drop into both eyes 2 (two) times daily.   latanoprost 0.005 % ophthalmic solution Commonly known as: XALATAN Place 1 drop into both eyes at bedtime.   omeprazole 40 MG capsule Commonly known as: PRILOSEC Take 40 mg by mouth daily.   tamsulosin 0.4 MG Caps capsule Commonly known as: FLOMAX Take 0.4 mg by mouth daily.       Allergies:  Allergies  Allergen Reactions  . Penicillins     Has patient had a PCN reaction causing immediate rash, facial/tongue/throat swelling, SOB or lightheadedness with hypotension: YES Has patient had a PCN reaction causing severe rash involving mucus membranes or skin necrosis: NO Has patient had a PCN reaction that required hospitalization: NO Has patient had a PCN reaction occurring within the last 10 years: NO If all of the above answers are "NO", then may proceed with Cephalosporin use.     No family  history on file.  Social History:  reports that he has never smoked. He has quit using smokeless tobacco. He reports that he does not drink alcohol and does not use drugs.  ROS: A complete review of systems was performed.  All systems are negative except for pertinent findings as noted.  Physical Exam:  Vital signs in last 24 hours: There were no vitals taken for this visit. Constitutional:  Alert and oriented, No acute distress Cardiovascular: Regular rate  Respiratory: Normal respiratory effort Neurologic: Grossly intact, no focal deficits Psychiatric: Normal  mood and affect  I have reviewed prior pt notes  I have reviewed notes from referring/previous physicians  I have reviewed urinalysis results  I have independently reviewed prior imaging  I have reviewed prior PSA results    Impression/Assessment:  1. BPH w/ LUTS: PVR shows pt has a moderate residual volume. Considering his severe LUTS and significant dizziness, he is a good candidate for a finasteride trial and possible surgical intervention depending on his response.  Plan:  1. Pt started on finasteride.  2. F/U in 3 months for OV and symptom recheck.  3. No need for further PSA testing in this 86 yo male  CC: Dr. Monico Blitz

## 2020-07-25 NOTE — Progress Notes (Signed)
Urological Symptom Review  Patient is experiencing the following symptoms: Frequent urination Get up at night to urinate Stream starts and stops Weak stream   Review of Systems  Gastrointestinal (upper)  : Negative for upper GI symptoms  Gastrointestinal (lower) : Negative for lower GI symptoms  Constitutional : Negative for symptoms  Skin: Negative for skin symptoms  Eyes: Negative for eye symptoms  Ear/Nose/Throat : Negative for Ear/Nose/Throat symptoms  Hematologic/Lymphatic: Negative for Hematologic/Lymphatic symptoms  Cardiovascular : Negative for cardiovascular symptoms  Respiratory : Negative for respiratory symptoms  Endocrine: Negative for endocrine symptoms  Musculoskeletal: Joint pain  Neurological: Negative for neurological symptoms  Psychologic: Negative for psychiatric symptoms

## 2020-07-27 IMAGING — CR DG CHEST 2V
2 series · 2 of 2 positions shown · non-contrast
Comparison: 10/07/2013

CLINICAL DATA: Cardiac device in place

EXAM:
CHEST - 2 VIEW

[chest lat]
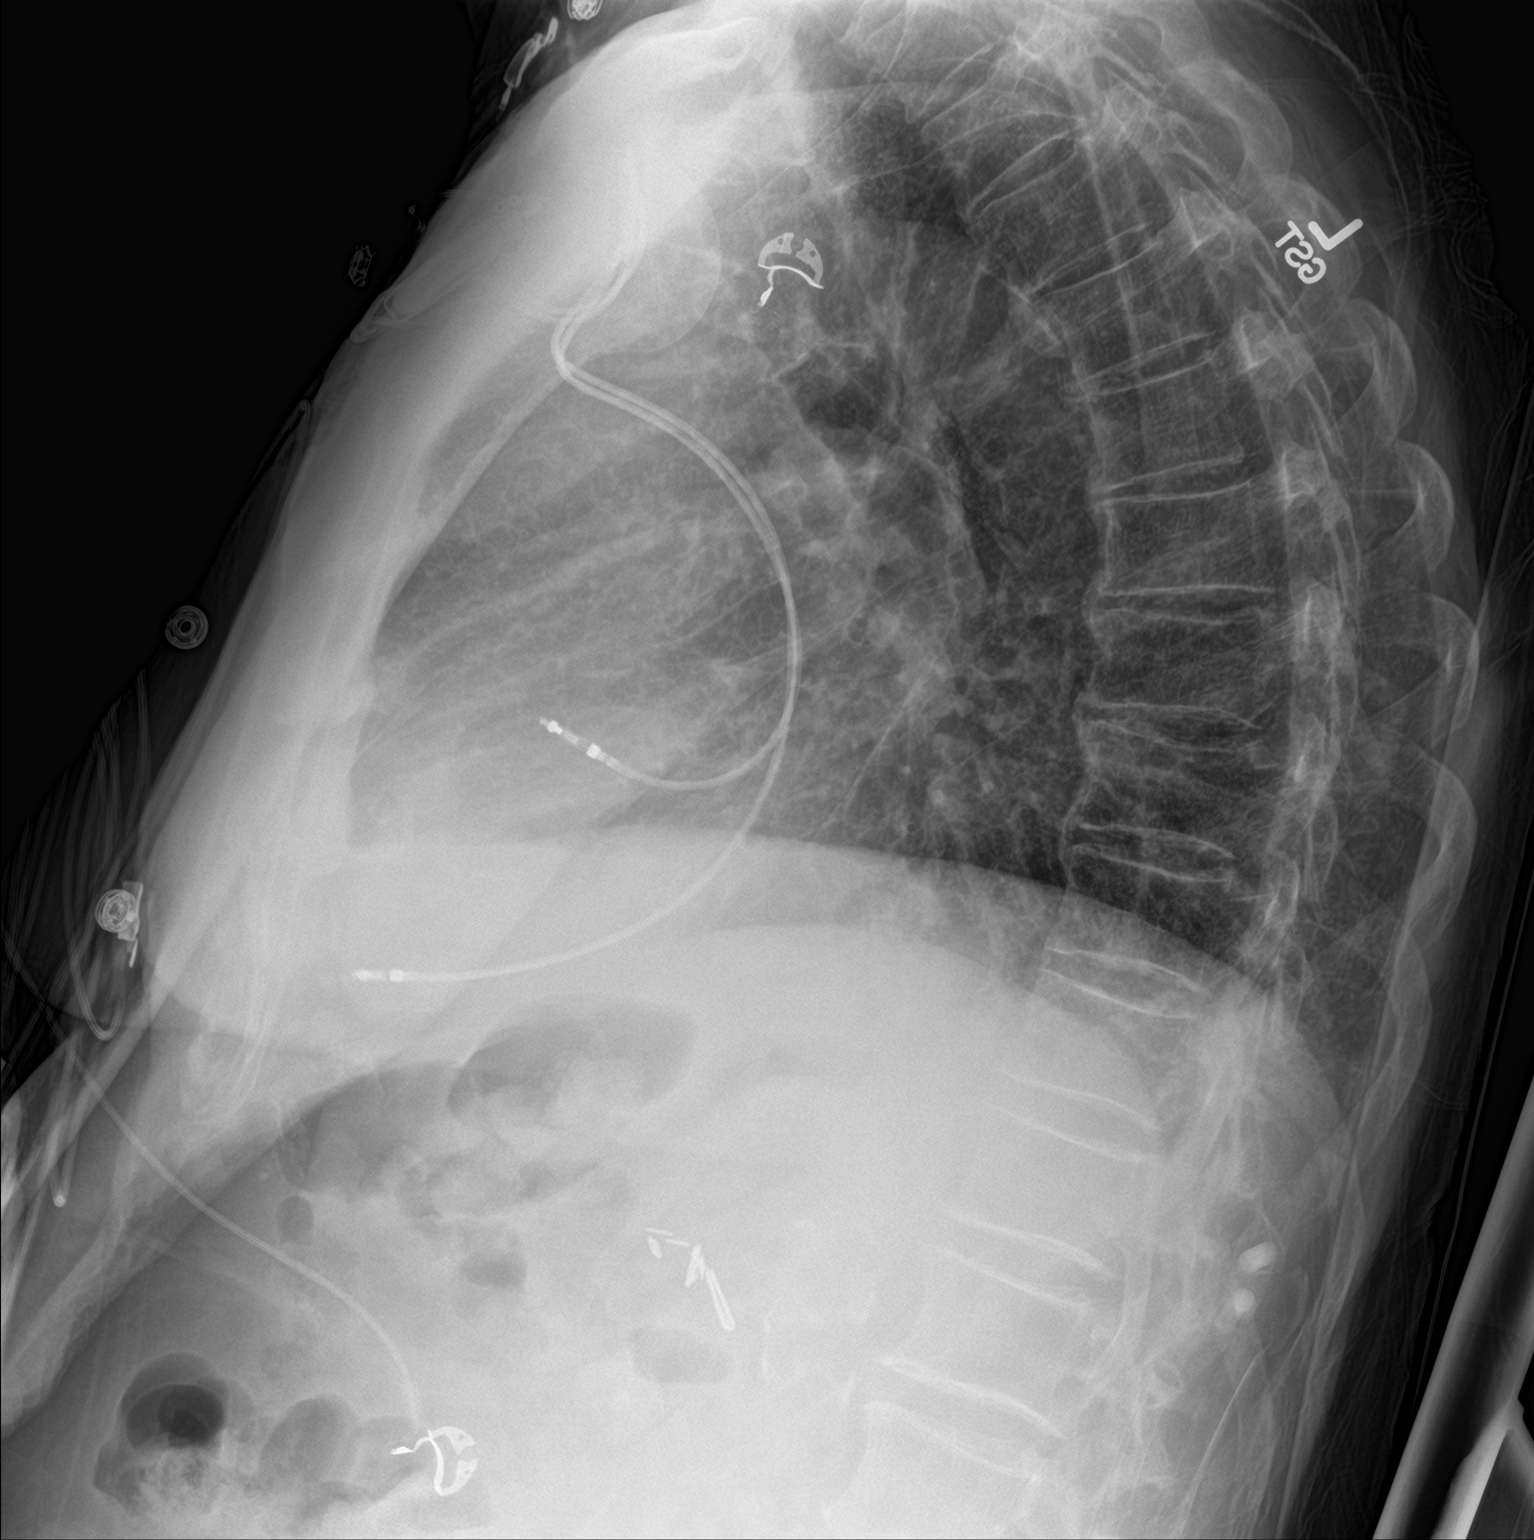

[chest ap]
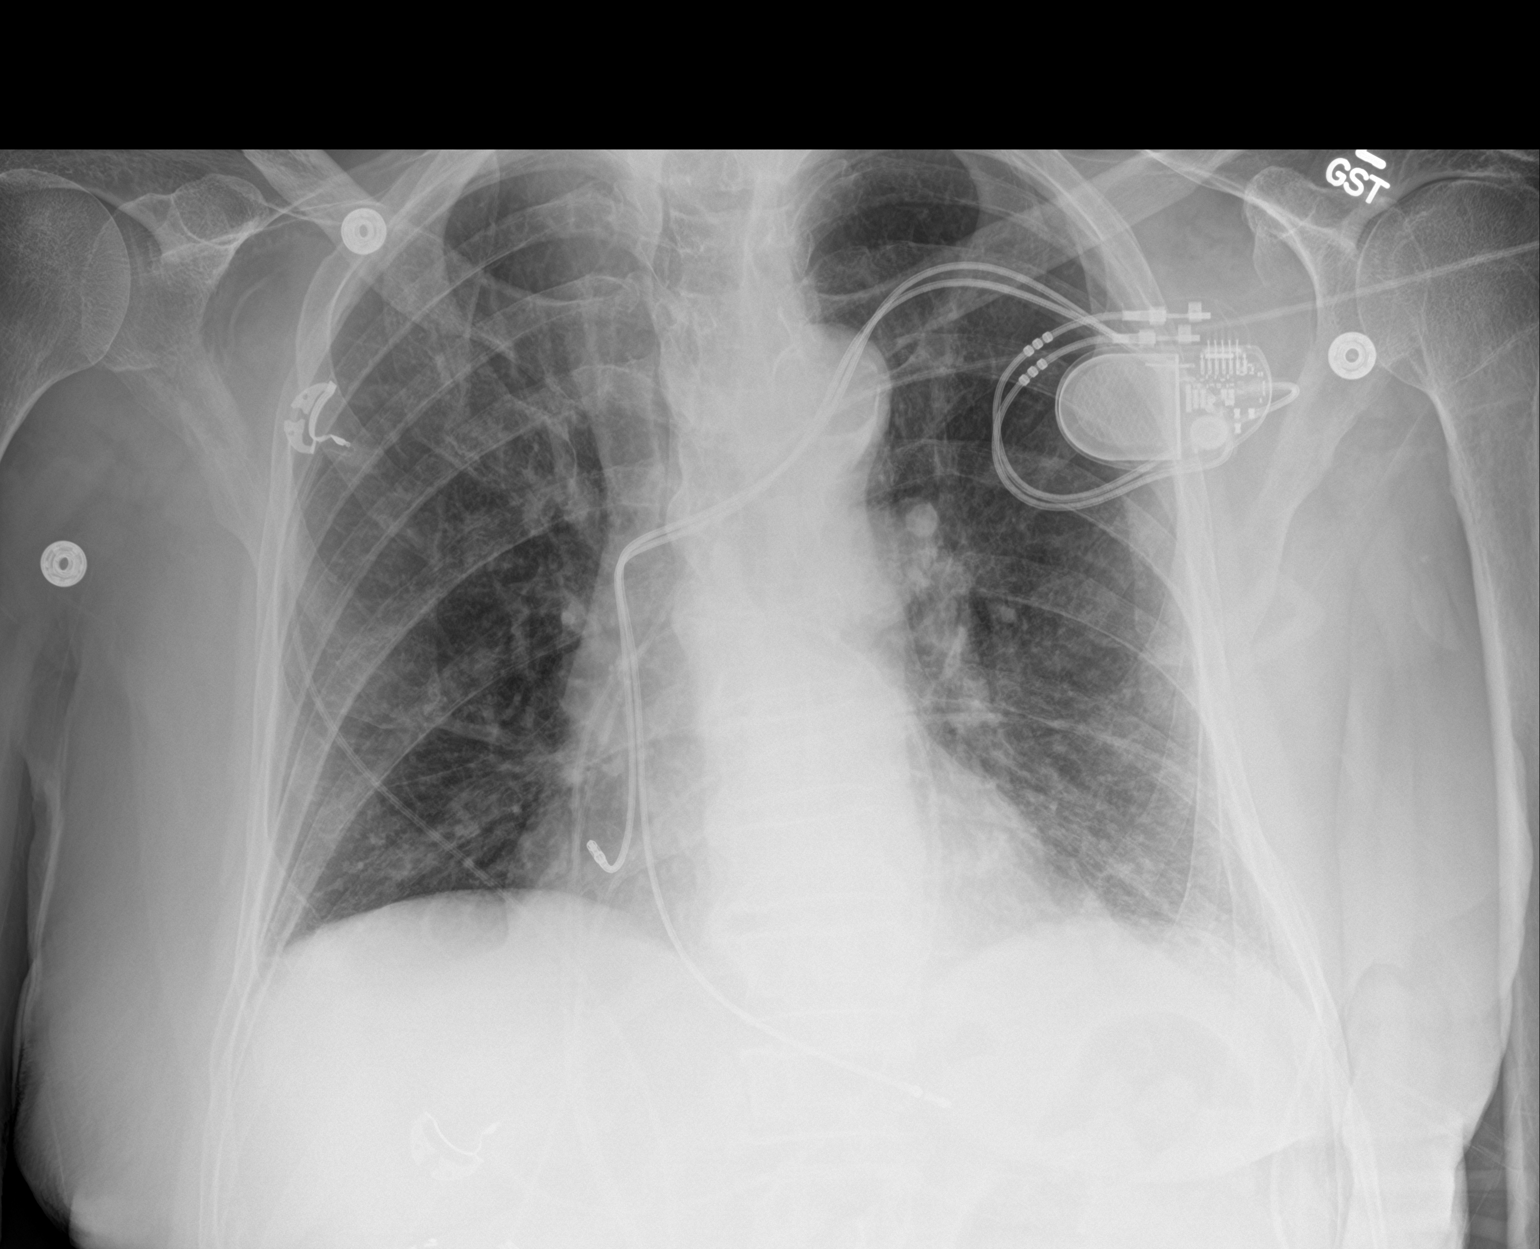

[2 of 2 positions shown; findings below may reference images not displayed]

FINDINGS: Dual-chamber pacer leads from the left in unremarkable position over
the expected right atrial appendage and right ventricular apex.
Stable normal cardiopericardial size. No pneumothorax. Mild
interstitial coarsening, likely atelectasis.
IMPRESSION: No complicating features after dual-chamber pacer placement.

## 2020-07-31 DIAGNOSIS — B351 Tinea unguium: Secondary | ICD-10-CM | POA: Diagnosis not present

## 2020-07-31 DIAGNOSIS — M79675 Pain in left toe(s): Secondary | ICD-10-CM | POA: Diagnosis not present

## 2020-07-31 DIAGNOSIS — M79674 Pain in right toe(s): Secondary | ICD-10-CM | POA: Diagnosis not present

## 2020-08-08 DIAGNOSIS — C679 Malignant neoplasm of bladder, unspecified: Secondary | ICD-10-CM | POA: Diagnosis not present

## 2020-08-08 DIAGNOSIS — K219 Gastro-esophageal reflux disease without esophagitis: Secondary | ICD-10-CM | POA: Diagnosis not present

## 2020-08-08 DIAGNOSIS — Z299 Encounter for prophylactic measures, unspecified: Secondary | ICD-10-CM | POA: Diagnosis not present

## 2020-08-08 DIAGNOSIS — R350 Frequency of micturition: Secondary | ICD-10-CM | POA: Diagnosis not present

## 2020-08-08 DIAGNOSIS — N419 Inflammatory disease of prostate, unspecified: Secondary | ICD-10-CM | POA: Diagnosis not present

## 2020-08-11 ENCOUNTER — Encounter: Payer: Medicare HMO | Admitting: Internal Medicine

## 2020-08-16 ENCOUNTER — Encounter (INDEPENDENT_AMBULATORY_CARE_PROVIDER_SITE_OTHER): Payer: Medicare HMO | Admitting: Ophthalmology

## 2020-08-25 ENCOUNTER — Encounter: Payer: Self-pay | Admitting: Internal Medicine

## 2020-08-25 ENCOUNTER — Ambulatory Visit: Payer: Medicare HMO | Admitting: Internal Medicine

## 2020-08-25 VITALS — BP 150/70 | HR 72 | Ht 72.0 in | Wt 200.6 lb

## 2020-08-25 DIAGNOSIS — I453 Trifascicular block: Secondary | ICD-10-CM

## 2020-08-25 DIAGNOSIS — I10 Essential (primary) hypertension: Secondary | ICD-10-CM | POA: Diagnosis not present

## 2020-08-25 NOTE — Progress Notes (Signed)
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  Since last being seen in our clinic, the patient reports doing very well for his age.  Only concern is with venous insuffiencey.  He wears support hose.  Today, he denies symptoms of palpitations, chest pain, shortness of breath,  dizziness, presyncope, or syncope.  The patient is otherwise without complaint today.   Past Medical History:  Diagnosis Date  . Abdominal pain   . Backache   . Bladder ulcer   . BPH (benign prostatic hyperplasia)   . Bradycardia   . Bronchitis   . Causalgia of lower limb   . Chest pain   . Constipation   . Dysphagia   . Elevated PSA   . Fatigue   . Gait difficulty   . GERD (gastroesophageal reflux disease)   . Glaucoma   . Gout   . History of kidney stones   . HOH (hard of hearing)   . Hx of gastroenteritis   . Hx of headache   . Hyperlipidemia   . Joint pain   . Macular degeneration   . Orthostatic hypotension   . Osteoarthritis   . Pancreatic cyst   . Restless leg   . Tick bite   . Toe pain   . Varicose veins of both lower extremities    Past Surgical History:  Procedure Laterality Date  . CHOLECYSTECTOMY    . PACEMAKER IMPLANT N/A 04/07/2018   [employer] Medical Assurity MRI conditional  dual-chamber pacemaker for symptomatic bradycardia by Dr Rayann Heman    ROS- all systems are reviewed and negative except as per HPI above  Current Outpatient Medications  Medication Sig Dispense Refill  . aspirin EC 81 MG tablet Take 1 tablet (81 mg total) by mouth daily. 90 tablet 3  . dorzolamide (TRUSOPT) 2 % ophthalmic solution Place 1 drop into both eyes 2 (two) times daily.   6  . latanoprost (XALATAN) 0.005 % ophthalmic solution Place 1 drop into both eyes at bedtime.  5  . omeprazole (PRILOSEC) 40 MG capsule Take 40 mg by mouth daily.    . finasteride (PROSCAR) 5 MG tablet Take 1 tablet (5 mg  total) by mouth daily. (Patient not taking: Reported on 08/25/2020) 90 tablet 3  . tamsulosin (FLOMAX) 0.4 MG CAPS capsule Take 0.4 mg by mouth daily.  (Patient not taking: No sig reported)     No current facility-administered medications for this visit.    Physical Exam: Vitals:   08/25/20 1125  BP: (!) 150/70  Pulse: 72  Weight: 200 lb 9.6 oz (91 kg)  Height: 6' (1.829 m)     GEN- The patient is well appearing, alert and oriented x 3 today.   Head- normocephalic, atraumatic Eyes-  Sclera clear, conjunctiva pink Ears- hearing intact Oropharynx- clear Lungs-   normal work of breathing Chest- pacemaker pocket is well healed Heart- Regular rate and rhythm  GI- soft, NT, ND, + BS Extremities- no clubbing, cyanosis, +edema  Pacemaker interrogation- reviewed in detail today,  See PACEART report   Assessment and Plan:  1. prior syncope with trifascicular heart block Normal pacemaker function See Pace Art report No changes today he is not device dependant today Syncope is resolved  2. HTN Stable No change required today   Risks, benefits and potential toxicities for medications prescribed and/or refilled reviewed with patient today.  Return in a year  Thompson Grayer MD, [facility] 08/25/2020 11:45 AM

## 2020-08-25 NOTE — Patient Instructions (Addendum)

## 2020-09-11 ENCOUNTER — Encounter (INDEPENDENT_AMBULATORY_CARE_PROVIDER_SITE_OTHER): Payer: Medicare HMO | Admitting: Ophthalmology

## 2020-09-13 ENCOUNTER — Other Ambulatory Visit: Payer: Self-pay

## 2020-09-13 ENCOUNTER — Encounter (INDEPENDENT_AMBULATORY_CARE_PROVIDER_SITE_OTHER): Payer: Medicare HMO | Admitting: Ophthalmology

## 2020-09-13 DIAGNOSIS — H353231 Exudative age-related macular degeneration, bilateral, with active choroidal neovascularization: Secondary | ICD-10-CM | POA: Diagnosis not present

## 2020-09-13 DIAGNOSIS — H43813 Vitreous degeneration, bilateral: Secondary | ICD-10-CM

## 2020-09-20 DIAGNOSIS — H353231 Exudative age-related macular degeneration, bilateral, with active choroidal neovascularization: Secondary | ICD-10-CM | POA: Diagnosis not present

## 2020-09-20 DIAGNOSIS — H401113 Primary open-angle glaucoma, right eye, severe stage: Secondary | ICD-10-CM | POA: Diagnosis not present

## 2020-09-20 DIAGNOSIS — H40012 Open angle with borderline findings, low risk, left eye: Secondary | ICD-10-CM | POA: Diagnosis not present

## 2020-09-20 DIAGNOSIS — Z961 Presence of intraocular lens: Secondary | ICD-10-CM | POA: Diagnosis not present

## 2020-09-21 DIAGNOSIS — Z6829 Body mass index (BMI) 29.0-29.9, adult: Secondary | ICD-10-CM | POA: Diagnosis not present

## 2020-09-21 DIAGNOSIS — Z789 Other specified health status: Secondary | ICD-10-CM | POA: Diagnosis not present

## 2020-09-21 DIAGNOSIS — H409 Unspecified glaucoma: Secondary | ICD-10-CM | POA: Diagnosis not present

## 2020-09-21 DIAGNOSIS — C679 Malignant neoplasm of bladder, unspecified: Secondary | ICD-10-CM | POA: Diagnosis not present

## 2020-09-21 DIAGNOSIS — H353290 Exudative age-related macular degeneration, unspecified eye, stage unspecified: Secondary | ICD-10-CM | POA: Diagnosis not present

## 2020-09-21 DIAGNOSIS — Z299 Encounter for prophylactic measures, unspecified: Secondary | ICD-10-CM | POA: Diagnosis not present

## 2020-09-21 DIAGNOSIS — N183 Chronic kidney disease, stage 3 unspecified: Secondary | ICD-10-CM | POA: Diagnosis not present

## 2020-10-04 ENCOUNTER — Encounter (INDEPENDENT_AMBULATORY_CARE_PROVIDER_SITE_OTHER): Payer: Medicare HMO | Admitting: Ophthalmology

## 2020-10-04 ENCOUNTER — Other Ambulatory Visit: Payer: Self-pay

## 2020-10-04 DIAGNOSIS — H353221 Exudative age-related macular degeneration, left eye, with active choroidal neovascularization: Secondary | ICD-10-CM

## 2020-10-18 ENCOUNTER — Ambulatory Visit (INDEPENDENT_AMBULATORY_CARE_PROVIDER_SITE_OTHER): Payer: Medicare HMO

## 2020-10-18 DIAGNOSIS — I495 Sick sinus syndrome: Secondary | ICD-10-CM

## 2020-10-20 LAB — CUP PACEART REMOTE DEVICE CHECK
Battery Remaining Longevity: 122 mo
Battery Remaining Percentage: 95.5 %
Battery Voltage: 3.01 V
Brady Statistic AP VP Percent: 12 %
Brady Statistic AP VS Percent: 68 %
Brady Statistic AS VP Percent: 1 %
Brady Statistic AS VS Percent: 20 %
Brady Statistic RA Percent Paced: 79 %
Brady Statistic RV Percent Paced: 12 %
Date Time Interrogation Session: 20220208020015
Implantable Lead Implant Date: 20190730
Implantable Lead Implant Date: 20190730
Implantable Lead Location: 753859
Implantable Lead Location: 753860
Implantable Pulse Generator Implant Date: 20190730
Lead Channel Impedance Value: 450 Ohm
Lead Channel Impedance Value: 480 Ohm
Lead Channel Pacing Threshold Amplitude: 0.75 V
Lead Channel Pacing Threshold Amplitude: 1.125 V
Lead Channel Pacing Threshold Pulse Width: 0.5 ms
Lead Channel Pacing Threshold Pulse Width: 0.5 ms
Lead Channel Sensing Intrinsic Amplitude: 12 mV
Lead Channel Sensing Intrinsic Amplitude: 3.6 mV
Lead Channel Setting Pacing Amplitude: 1.375
Lead Channel Setting Pacing Amplitude: 1.75 V
Lead Channel Setting Pacing Pulse Width: 0.5 ms
Lead Channel Setting Sensing Sensitivity: 2 mV
Pulse Gen Model: 2272
Pulse Gen Serial Number: 9049493

## 2020-10-24 ENCOUNTER — Ambulatory Visit: Payer: Medicare HMO | Admitting: Urology

## 2020-10-24 NOTE — Progress Notes (Signed)
Remote pacemaker transmission.   

## 2020-10-25 DIAGNOSIS — H353231 Exudative age-related macular degeneration, bilateral, with active choroidal neovascularization: Secondary | ICD-10-CM | POA: Diagnosis not present

## 2020-10-25 DIAGNOSIS — H401113 Primary open-angle glaucoma, right eye, severe stage: Secondary | ICD-10-CM | POA: Diagnosis not present

## 2020-10-25 DIAGNOSIS — H40012 Open angle with borderline findings, low risk, left eye: Secondary | ICD-10-CM | POA: Diagnosis not present

## 2020-10-25 DIAGNOSIS — Z961 Presence of intraocular lens: Secondary | ICD-10-CM | POA: Diagnosis not present

## 2020-12-31 DIAGNOSIS — I4510 Unspecified right bundle-branch block: Secondary | ICD-10-CM | POA: Diagnosis not present

## 2020-12-31 DIAGNOSIS — I440 Atrioventricular block, first degree: Secondary | ICD-10-CM | POA: Diagnosis not present

## 2020-12-31 DIAGNOSIS — Z880 Allergy status to penicillin: Secondary | ICD-10-CM | POA: Diagnosis not present

## 2020-12-31 DIAGNOSIS — M79604 Pain in right leg: Secondary | ICD-10-CM | POA: Diagnosis not present

## 2020-12-31 DIAGNOSIS — I10 Essential (primary) hypertension: Secondary | ICD-10-CM | POA: Diagnosis not present

## 2020-12-31 DIAGNOSIS — R5383 Other fatigue: Secondary | ICD-10-CM | POA: Diagnosis not present

## 2020-12-31 DIAGNOSIS — M79605 Pain in left leg: Secondary | ICD-10-CM | POA: Diagnosis not present

## 2020-12-31 DIAGNOSIS — R410 Disorientation, unspecified: Secondary | ICD-10-CM | POA: Diagnosis not present

## 2020-12-31 DIAGNOSIS — R8290 Unspecified abnormal findings in urine: Secondary | ICD-10-CM | POA: Diagnosis not present

## 2020-12-31 DIAGNOSIS — Z743 Need for continuous supervision: Secondary | ICD-10-CM | POA: Diagnosis not present

## 2020-12-31 DIAGNOSIS — R9431 Abnormal electrocardiogram [ECG] [EKG]: Secondary | ICD-10-CM | POA: Diagnosis not present

## 2020-12-31 DIAGNOSIS — R531 Weakness: Secondary | ICD-10-CM | POA: Diagnosis not present

## 2020-12-31 DIAGNOSIS — R404 Transient alteration of awareness: Secondary | ICD-10-CM | POA: Diagnosis not present

## 2021-01-09 DIAGNOSIS — Z299 Encounter for prophylactic measures, unspecified: Secondary | ICD-10-CM | POA: Diagnosis not present

## 2021-01-09 DIAGNOSIS — Z950 Presence of cardiac pacemaker: Secondary | ICD-10-CM | POA: Diagnosis not present

## 2021-01-09 DIAGNOSIS — D1723 Benign lipomatous neoplasm of skin and subcutaneous tissue of right leg: Secondary | ICD-10-CM | POA: Diagnosis not present

## 2021-01-09 DIAGNOSIS — L919 Hypertrophic disorder of the skin, unspecified: Secondary | ICD-10-CM | POA: Diagnosis not present

## 2021-01-09 DIAGNOSIS — R350 Frequency of micturition: Secondary | ICD-10-CM | POA: Diagnosis not present

## 2021-01-09 DIAGNOSIS — R600 Localized edema: Secondary | ICD-10-CM | POA: Diagnosis not present

## 2021-01-09 DIAGNOSIS — L989 Disorder of the skin and subcutaneous tissue, unspecified: Secondary | ICD-10-CM | POA: Diagnosis not present

## 2021-01-16 LAB — CUP PACEART REMOTE DEVICE CHECK
Battery Remaining Longevity: 121 mo
Battery Remaining Percentage: 95.5 %
Battery Voltage: 3.01 V
Brady Statistic AP VP Percent: 13 %
Brady Statistic AP VS Percent: 67 %
Brady Statistic AS VP Percent: 1 %
Brady Statistic AS VS Percent: 20 %
Brady Statistic RA Percent Paced: 79 %
Brady Statistic RV Percent Paced: 13 %
Date Time Interrogation Session: 20220510020014
Implantable Lead Implant Date: 20190730
Implantable Lead Implant Date: 20190730
Implantable Lead Location: 753859
Implantable Lead Location: 753860
Implantable Pulse Generator Implant Date: 20190730
Lead Channel Impedance Value: 440 Ohm
Lead Channel Impedance Value: 460 Ohm
Lead Channel Pacing Threshold Amplitude: 0.75 V
Lead Channel Pacing Threshold Amplitude: 1.375 V
Lead Channel Pacing Threshold Pulse Width: 0.5 ms
Lead Channel Pacing Threshold Pulse Width: 0.5 ms
Lead Channel Sensing Intrinsic Amplitude: 12 mV
Lead Channel Sensing Intrinsic Amplitude: 3.4 mV
Lead Channel Setting Pacing Amplitude: 1.625
Lead Channel Setting Pacing Amplitude: 1.75 V
Lead Channel Setting Pacing Pulse Width: 0.5 ms
Lead Channel Setting Sensing Sensitivity: 2 mV
Pulse Gen Model: 2272
Pulse Gen Serial Number: 9049493

## 2021-01-17 ENCOUNTER — Ambulatory Visit (INDEPENDENT_AMBULATORY_CARE_PROVIDER_SITE_OTHER): Payer: Medicare HMO

## 2021-01-17 ENCOUNTER — Encounter (INDEPENDENT_AMBULATORY_CARE_PROVIDER_SITE_OTHER): Payer: Medicare HMO | Admitting: Ophthalmology

## 2021-01-17 ENCOUNTER — Other Ambulatory Visit: Payer: Self-pay

## 2021-01-17 DIAGNOSIS — H353112 Nonexudative age-related macular degeneration, right eye, intermediate dry stage: Secondary | ICD-10-CM | POA: Diagnosis not present

## 2021-01-17 DIAGNOSIS — H43813 Vitreous degeneration, bilateral: Secondary | ICD-10-CM | POA: Diagnosis not present

## 2021-01-17 DIAGNOSIS — H353221 Exudative age-related macular degeneration, left eye, with active choroidal neovascularization: Secondary | ICD-10-CM | POA: Diagnosis not present

## 2021-01-17 DIAGNOSIS — I495 Sick sinus syndrome: Secondary | ICD-10-CM

## 2021-01-18 ENCOUNTER — Encounter (INDEPENDENT_AMBULATORY_CARE_PROVIDER_SITE_OTHER): Payer: Medicare HMO | Admitting: Ophthalmology

## 2021-01-24 DIAGNOSIS — N183 Chronic kidney disease, stage 3 unspecified: Secondary | ICD-10-CM | POA: Diagnosis not present

## 2021-01-24 DIAGNOSIS — I495 Sick sinus syndrome: Secondary | ICD-10-CM | POA: Diagnosis not present

## 2021-01-24 DIAGNOSIS — Z299 Encounter for prophylactic measures, unspecified: Secondary | ICD-10-CM | POA: Diagnosis not present

## 2021-01-24 DIAGNOSIS — K219 Gastro-esophageal reflux disease without esophagitis: Secondary | ICD-10-CM | POA: Diagnosis not present

## 2021-01-24 DIAGNOSIS — R600 Localized edema: Secondary | ICD-10-CM | POA: Diagnosis not present

## 2021-01-24 DIAGNOSIS — Z6829 Body mass index (BMI) 29.0-29.9, adult: Secondary | ICD-10-CM | POA: Diagnosis not present

## 2021-02-06 DIAGNOSIS — R030 Elevated blood-pressure reading, without diagnosis of hypertension: Secondary | ICD-10-CM | POA: Diagnosis not present

## 2021-02-06 DIAGNOSIS — N529 Male erectile dysfunction, unspecified: Secondary | ICD-10-CM | POA: Diagnosis not present

## 2021-02-06 DIAGNOSIS — G629 Polyneuropathy, unspecified: Secondary | ICD-10-CM | POA: Diagnosis not present

## 2021-02-06 DIAGNOSIS — K08109 Complete loss of teeth, unspecified cause, unspecified class: Secondary | ICD-10-CM | POA: Diagnosis not present

## 2021-02-06 DIAGNOSIS — N3941 Urge incontinence: Secondary | ICD-10-CM | POA: Diagnosis not present

## 2021-02-06 DIAGNOSIS — H409 Unspecified glaucoma: Secondary | ICD-10-CM | POA: Diagnosis not present

## 2021-02-06 DIAGNOSIS — H547 Unspecified visual loss: Secondary | ICD-10-CM | POA: Diagnosis not present

## 2021-02-06 DIAGNOSIS — K219 Gastro-esophageal reflux disease without esophagitis: Secondary | ICD-10-CM | POA: Diagnosis not present

## 2021-02-06 DIAGNOSIS — H3530 Unspecified macular degeneration: Secondary | ICD-10-CM | POA: Diagnosis not present

## 2021-02-06 DIAGNOSIS — I739 Peripheral vascular disease, unspecified: Secondary | ICD-10-CM | POA: Diagnosis not present

## 2021-02-08 NOTE — Progress Notes (Signed)
Remote pacemaker transmission.   

## 2021-02-21 DIAGNOSIS — Z961 Presence of intraocular lens: Secondary | ICD-10-CM | POA: Diagnosis not present

## 2021-02-21 DIAGNOSIS — H40012 Open angle with borderline findings, low risk, left eye: Secondary | ICD-10-CM | POA: Diagnosis not present

## 2021-02-21 DIAGNOSIS — H353231 Exudative age-related macular degeneration, bilateral, with active choroidal neovascularization: Secondary | ICD-10-CM | POA: Diagnosis not present

## 2021-02-21 DIAGNOSIS — H401113 Primary open-angle glaucoma, right eye, severe stage: Secondary | ICD-10-CM | POA: Diagnosis not present

## 2021-03-21 DIAGNOSIS — Z6828 Body mass index (BMI) 28.0-28.9, adult: Secondary | ICD-10-CM | POA: Diagnosis not present

## 2021-03-21 DIAGNOSIS — I495 Sick sinus syndrome: Secondary | ICD-10-CM | POA: Diagnosis not present

## 2021-03-21 DIAGNOSIS — N183 Chronic kidney disease, stage 3 unspecified: Secondary | ICD-10-CM | POA: Diagnosis not present

## 2021-03-21 DIAGNOSIS — Z299 Encounter for prophylactic measures, unspecified: Secondary | ICD-10-CM | POA: Diagnosis not present

## 2021-03-21 DIAGNOSIS — I739 Peripheral vascular disease, unspecified: Secondary | ICD-10-CM | POA: Diagnosis not present

## 2021-03-21 DIAGNOSIS — H353290 Exudative age-related macular degeneration, unspecified eye, stage unspecified: Secondary | ICD-10-CM | POA: Diagnosis not present

## 2021-04-18 ENCOUNTER — Ambulatory Visit (INDEPENDENT_AMBULATORY_CARE_PROVIDER_SITE_OTHER): Payer: Medicare HMO

## 2021-04-18 DIAGNOSIS — I495 Sick sinus syndrome: Secondary | ICD-10-CM

## 2021-04-18 LAB — CUP PACEART REMOTE DEVICE CHECK
Battery Remaining Longevity: 90 mo
Battery Remaining Percentage: 75 %
Battery Voltage: 3.01 V
Brady Statistic AP VP Percent: 15 %
Brady Statistic AP VS Percent: 66 %
Brady Statistic AS VP Percent: 1 %
Brady Statistic AS VS Percent: 20 %
Brady Statistic RA Percent Paced: 80 %
Brady Statistic RV Percent Paced: 15 %
Date Time Interrogation Session: 20220809020014
Implantable Lead Implant Date: 20190730
Implantable Lead Implant Date: 20190730
Implantable Lead Location: 753859
Implantable Lead Location: 753860
Implantable Pulse Generator Implant Date: 20190730
Lead Channel Impedance Value: 450 Ohm
Lead Channel Impedance Value: 480 Ohm
Lead Channel Pacing Threshold Amplitude: 0.75 V
Lead Channel Pacing Threshold Amplitude: 1.125 V
Lead Channel Pacing Threshold Pulse Width: 0.5 ms
Lead Channel Pacing Threshold Pulse Width: 0.5 ms
Lead Channel Sensing Intrinsic Amplitude: 12 mV
Lead Channel Sensing Intrinsic Amplitude: 4.4 mV
Lead Channel Setting Pacing Amplitude: 1.375
Lead Channel Setting Pacing Amplitude: 1.75 V
Lead Channel Setting Pacing Pulse Width: 0.5 ms
Lead Channel Setting Sensing Sensitivity: 2 mV
Pulse Gen Model: 2272
Pulse Gen Serial Number: 9049493

## 2021-04-25 ENCOUNTER — Other Ambulatory Visit: Payer: Self-pay

## 2021-04-25 ENCOUNTER — Encounter (INDEPENDENT_AMBULATORY_CARE_PROVIDER_SITE_OTHER): Payer: Medicare HMO | Admitting: Ophthalmology

## 2021-04-25 DIAGNOSIS — H353221 Exudative age-related macular degeneration, left eye, with active choroidal neovascularization: Secondary | ICD-10-CM

## 2021-04-25 DIAGNOSIS — H43812 Vitreous degeneration, left eye: Secondary | ICD-10-CM | POA: Diagnosis not present

## 2021-05-10 NOTE — Progress Notes (Signed)
Remote pacemaker transmission.   

## 2021-05-23 DIAGNOSIS — Z0000 Encounter for general adult medical examination without abnormal findings: Secondary | ICD-10-CM | POA: Diagnosis not present

## 2021-05-23 DIAGNOSIS — Z79899 Other long term (current) drug therapy: Secondary | ICD-10-CM | POA: Diagnosis not present

## 2021-05-23 DIAGNOSIS — Z125 Encounter for screening for malignant neoplasm of prostate: Secondary | ICD-10-CM | POA: Diagnosis not present

## 2021-05-23 DIAGNOSIS — Z1331 Encounter for screening for depression: Secondary | ICD-10-CM | POA: Diagnosis not present

## 2021-05-23 DIAGNOSIS — Z7189 Other specified counseling: Secondary | ICD-10-CM | POA: Diagnosis not present

## 2021-05-23 DIAGNOSIS — Z6828 Body mass index (BMI) 28.0-28.9, adult: Secondary | ICD-10-CM | POA: Diagnosis not present

## 2021-05-23 DIAGNOSIS — Z2821 Immunization not carried out because of patient refusal: Secondary | ICD-10-CM | POA: Diagnosis not present

## 2021-05-23 DIAGNOSIS — E7800 Pure hypercholesterolemia, unspecified: Secondary | ICD-10-CM | POA: Diagnosis not present

## 2021-05-23 DIAGNOSIS — Z299 Encounter for prophylactic measures, unspecified: Secondary | ICD-10-CM | POA: Diagnosis not present

## 2021-05-23 DIAGNOSIS — R5383 Other fatigue: Secondary | ICD-10-CM | POA: Diagnosis not present

## 2021-05-23 DIAGNOSIS — Z1339 Encounter for screening examination for other mental health and behavioral disorders: Secondary | ICD-10-CM | POA: Diagnosis not present

## 2021-06-26 DIAGNOSIS — H401113 Primary open-angle glaucoma, right eye, severe stage: Secondary | ICD-10-CM | POA: Diagnosis not present

## 2021-06-26 DIAGNOSIS — H353231 Exudative age-related macular degeneration, bilateral, with active choroidal neovascularization: Secondary | ICD-10-CM | POA: Diagnosis not present

## 2021-06-26 DIAGNOSIS — H40012 Open angle with borderline findings, low risk, left eye: Secondary | ICD-10-CM | POA: Diagnosis not present

## 2021-06-26 DIAGNOSIS — Z961 Presence of intraocular lens: Secondary | ICD-10-CM | POA: Diagnosis not present

## 2021-07-17 LAB — CUP PACEART REMOTE DEVICE CHECK
Battery Remaining Longevity: 86 mo
Battery Remaining Percentage: 72 %
Battery Voltage: 3.01 V
Brady Statistic AP VP Percent: 16 %
Brady Statistic AP VS Percent: 64 %
Brady Statistic AS VP Percent: 1 %
Brady Statistic AS VS Percent: 19 %
Brady Statistic RA Percent Paced: 80 %
Brady Statistic RV Percent Paced: 16 %
Date Time Interrogation Session: 20221108023704
Implantable Lead Implant Date: 20190730
Implantable Lead Implant Date: 20190730
Implantable Lead Location: 753859
Implantable Lead Location: 753860
Implantable Pulse Generator Implant Date: 20190730
Lead Channel Impedance Value: 440 Ohm
Lead Channel Impedance Value: 450 Ohm
Lead Channel Pacing Threshold Amplitude: 0.625 V
Lead Channel Pacing Threshold Amplitude: 1.125 V
Lead Channel Pacing Threshold Pulse Width: 0.5 ms
Lead Channel Pacing Threshold Pulse Width: 0.5 ms
Lead Channel Sensing Intrinsic Amplitude: 12 mV
Lead Channel Sensing Intrinsic Amplitude: 3.9 mV
Lead Channel Setting Pacing Amplitude: 1.375
Lead Channel Setting Pacing Amplitude: 1.625
Lead Channel Setting Pacing Pulse Width: 0.5 ms
Lead Channel Setting Sensing Sensitivity: 2 mV
Pulse Gen Model: 2272
Pulse Gen Serial Number: 9049493

## 2021-07-18 ENCOUNTER — Ambulatory Visit (INDEPENDENT_AMBULATORY_CARE_PROVIDER_SITE_OTHER): Payer: Medicare HMO

## 2021-07-18 DIAGNOSIS — I495 Sick sinus syndrome: Secondary | ICD-10-CM

## 2021-07-26 NOTE — Progress Notes (Signed)
Remote pacemaker transmission.   

## 2021-08-08 ENCOUNTER — Encounter (INDEPENDENT_AMBULATORY_CARE_PROVIDER_SITE_OTHER): Payer: Medicare HMO | Admitting: Ophthalmology

## 2021-08-08 ENCOUNTER — Other Ambulatory Visit: Payer: Self-pay

## 2021-08-08 DIAGNOSIS — H353221 Exudative age-related macular degeneration, left eye, with active choroidal neovascularization: Secondary | ICD-10-CM | POA: Diagnosis not present

## 2021-08-08 DIAGNOSIS — H43812 Vitreous degeneration, left eye: Secondary | ICD-10-CM | POA: Diagnosis not present

## 2021-08-10 ENCOUNTER — Encounter: Payer: Medicare HMO | Admitting: Internal Medicine

## 2021-08-15 ENCOUNTER — Telehealth: Payer: Self-pay | Admitting: Internal Medicine

## 2021-08-15 NOTE — Telephone Encounter (Signed)
Threasa Beards that brings him is sick - rescheduled till 09/14/2021.

## 2021-08-15 NOTE — Telephone Encounter (Signed)
New Message:     Patient says his transportation for his appointment on Friday(08-17-21) with Dr Rayann Heman is ick. He wants to know since this will be his last visit with Dr Rayann Heman, can he have a telephone visit please?

## 2021-08-17 ENCOUNTER — Encounter: Payer: Self-pay | Admitting: Internal Medicine

## 2021-09-14 ENCOUNTER — Encounter: Payer: Self-pay | Admitting: Internal Medicine

## 2021-09-14 ENCOUNTER — Ambulatory Visit: Payer: Medicare HMO | Admitting: Internal Medicine

## 2021-09-14 VITALS — BP 172/75 | HR 60 | Ht 74.0 in | Wt 205.0 lb

## 2021-09-14 DIAGNOSIS — I10 Essential (primary) hypertension: Secondary | ICD-10-CM | POA: Diagnosis not present

## 2021-09-14 DIAGNOSIS — I453 Trifascicular block: Secondary | ICD-10-CM

## 2021-09-14 LAB — CUP PACEART INCLINIC DEVICE CHECK
Battery Remaining Longevity: 49 mo
Battery Voltage: 2.98 V
Brady Statistic RA Percent Paced: 80 %
Brady Statistic RV Percent Paced: 17 %
Date Time Interrogation Session: 20230106144207
Implantable Lead Implant Date: 20190730
Implantable Lead Implant Date: 20190730
Implantable Lead Location: 753859
Implantable Lead Location: 753860
Implantable Pulse Generator Implant Date: 20190730
Lead Channel Impedance Value: 450 Ohm
Lead Channel Impedance Value: 487.5 Ohm
Lead Channel Pacing Threshold Amplitude: 0.625 V
Lead Channel Pacing Threshold Amplitude: 1 V
Lead Channel Pacing Threshold Pulse Width: 0.5 ms
Lead Channel Pacing Threshold Pulse Width: 0.5 ms
Lead Channel Sensing Intrinsic Amplitude: 12 mV
Lead Channel Sensing Intrinsic Amplitude: 4.1 mV
Lead Channel Setting Pacing Amplitude: 1.25 V
Lead Channel Setting Pacing Amplitude: 1.625
Lead Channel Setting Pacing Pulse Width: 0.5 ms
Lead Channel Setting Sensing Sensitivity: 2 mV
Pulse Gen Model: 2272
Pulse Gen Serial Number: 9049493

## 2021-09-14 NOTE — Patient Instructions (Signed)
Medication Instructions:  Continue all current medications.  Labwork: none  Testing/Procedures: none  Follow-Up: 1 year - Dr.  Allred   Any Other Special Instructions Will Be Listed Below (If Applicable).   If you need a refill on your cardiac medications before your next appointment, please call your pharmacy.  

## 2021-09-14 NOTE — Progress Notes (Signed)
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 male who presents today for routine electrophysiology followup.  Since last being seen in our clinic, the patient reports doing very well.  + mild edema,  Today, he denies symptoms of palpitations, chest pain, shortness of breath, dizziness, presyncope, or syncope.  The patient is otherwise without complaint today.   Past Medical History:  Diagnosis Date   Abdominal pain    Backache    Bladder ulcer    BPH (benign prostatic hyperplasia)    Bradycardia    Bronchitis    Causalgia of lower limb    Chest pain    Constipation    Dysphagia    Elevated PSA    Fatigue    Gait difficulty    GERD (gastroesophageal reflux disease)    Glaucoma    Gout    History of kidney stones    HOH (hard of hearing)    Hx of gastroenteritis    Hx of headache    Hyperlipidemia    Joint pain    Macular degeneration    Orthostatic hypotension    Osteoarthritis    Pancreatic cyst    Restless leg    Tick bite    Toe pain    Varicose veins of both lower extremities    Past Surgical History:  Procedure Laterality Date   CHOLECYSTECTOMY     PACEMAKER IMPLANT N/A 04/07/2018   St Jude Medical Assurity MRI conditional  dual-chamber pacemaker for symptomatic bradycardia by Dr Rayann Heman    ROS- all systems are reviewed and negative except as per HPI above  Current Outpatient Medications  Medication Sig Dispense Refill   aspirin EC 81 MG tablet Take 1 tablet (81 mg total) by mouth daily. 90 tablet 3   dorzolamide (TRUSOPT) 2 % ophthalmic solution Place 1 drop into both eyes 2 (two) times daily.   6   finasteride (PROSCAR) 5 MG tablet Take 1 tablet (5 mg total) by mouth daily. 90 tablet 3   furosemide (LASIX) 20 MG tablet Take 20 mg by mouth.     latanoprost (XALATAN) 0.005 % ophthalmic solution Place 1 drop into both eyes at bedtime.  5   omeprazole (PRILOSEC) 40 MG capsule Take 40 mg by mouth daily.     potassium  chloride (KLOR-CON M) 10 MEQ tablet Take 10 mEq by mouth 2 (two) times daily.     No current facility-administered medications for this visit.    Physical Exam: Vitals:   09/14/21 1420  BP: (!) 172/75  Pulse: 60  Weight: 205 lb (93 kg)  Height: 6\' 2"  (1.88 m)    GEN- The patient is elderly appearing, alert and oriented x 3 today.   Head- normocephalic, atraumatic Eyes-  Sclera clear, conjunctiva pink Ears- hearing intact Oropharynx- clear Lungs- Clear to ausculation bilaterally, normal work of breathing Chest- pacemaker pocket is well healed Heart- Regular rate and rhythm, no murmurs, rubs or gallops, PMI not laterally displaced GI- soft, NT, ND, + BS Extremities- no clubbing, cyanosis, mild edema  Pacemaker interrogation- reviewed in detail today,  See PACEART report  ekg tracing ordered today is personally reviewed and shows atrial paced, RBBB, LAHB  Assessment and Plan:  1. Symptomatic syncope with trifascicular block Normal pacemaker function See Pace Art report No changes today he is not device dependant today  2. HTN Stable No change required today  Return to see EP In  a year  Thompson Grayer MD, [facility] 09/14/2021 2:49 PM

## 2021-10-08 DIAGNOSIS — L110 Acquired keratosis follicularis: Secondary | ICD-10-CM | POA: Diagnosis not present

## 2021-10-08 DIAGNOSIS — M79671 Pain in right foot: Secondary | ICD-10-CM | POA: Diagnosis not present

## 2021-10-08 DIAGNOSIS — M79675 Pain in left toe(s): Secondary | ICD-10-CM | POA: Diagnosis not present

## 2021-10-08 DIAGNOSIS — I739 Peripheral vascular disease, unspecified: Secondary | ICD-10-CM | POA: Diagnosis not present

## 2021-10-08 DIAGNOSIS — M79674 Pain in right toe(s): Secondary | ICD-10-CM | POA: Diagnosis not present

## 2021-10-08 DIAGNOSIS — M79672 Pain in left foot: Secondary | ICD-10-CM | POA: Diagnosis not present

## 2021-10-09 DIAGNOSIS — E782 Mixed hyperlipidemia: Secondary | ICD-10-CM | POA: Diagnosis not present

## 2021-10-09 DIAGNOSIS — I10 Essential (primary) hypertension: Secondary | ICD-10-CM | POA: Diagnosis not present

## 2021-10-16 LAB — CUP PACEART REMOTE DEVICE CHECK
Battery Remaining Longevity: 79 mo
Battery Remaining Percentage: 69 %
Battery Voltage: 2.99 V
Brady Statistic AP VP Percent: 34 %
Brady Statistic AP VS Percent: 51 %
Brady Statistic AS VP Percent: 1 %
Brady Statistic AS VS Percent: 15 %
Brady Statistic RA Percent Paced: 84 %
Brady Statistic RV Percent Paced: 34 %
Date Time Interrogation Session: 20230207020013
Implantable Lead Implant Date: 20190730
Implantable Lead Implant Date: 20190730
Implantable Lead Location: 753859
Implantable Lead Location: 753860
Implantable Pulse Generator Implant Date: 20190730
Lead Channel Impedance Value: 430 Ohm
Lead Channel Impedance Value: 460 Ohm
Lead Channel Pacing Threshold Amplitude: 0.625 V
Lead Channel Pacing Threshold Amplitude: 1 V
Lead Channel Pacing Threshold Pulse Width: 0.5 ms
Lead Channel Pacing Threshold Pulse Width: 0.5 ms
Lead Channel Sensing Intrinsic Amplitude: 12 mV
Lead Channel Sensing Intrinsic Amplitude: 3.8 mV
Lead Channel Setting Pacing Amplitude: 1.25 V
Lead Channel Setting Pacing Amplitude: 1.625
Lead Channel Setting Pacing Pulse Width: 0.5 ms
Lead Channel Setting Sensing Sensitivity: 2 mV
Pulse Gen Model: 2272
Pulse Gen Serial Number: 9049493

## 2021-10-17 ENCOUNTER — Ambulatory Visit (INDEPENDENT_AMBULATORY_CARE_PROVIDER_SITE_OTHER): Payer: Medicare HMO

## 2021-10-17 DIAGNOSIS — I495 Sick sinus syndrome: Secondary | ICD-10-CM | POA: Diagnosis not present

## 2021-10-22 NOTE — Progress Notes (Signed)
Remote pacemaker transmission.   

## 2021-10-29 DIAGNOSIS — Z789 Other specified health status: Secondary | ICD-10-CM | POA: Diagnosis not present

## 2021-10-29 DIAGNOSIS — G459 Transient cerebral ischemic attack, unspecified: Secondary | ICD-10-CM | POA: Diagnosis not present

## 2021-10-29 DIAGNOSIS — R509 Fever, unspecified: Secondary | ICD-10-CM | POA: Diagnosis not present

## 2021-10-29 DIAGNOSIS — R0981 Nasal congestion: Secondary | ICD-10-CM | POA: Diagnosis not present

## 2021-10-29 DIAGNOSIS — R600 Localized edema: Secondary | ICD-10-CM | POA: Diagnosis not present

## 2021-10-29 DIAGNOSIS — Z299 Encounter for prophylactic measures, unspecified: Secondary | ICD-10-CM | POA: Diagnosis not present

## 2021-10-29 DIAGNOSIS — Z6828 Body mass index (BMI) 28.0-28.9, adult: Secondary | ICD-10-CM | POA: Diagnosis not present

## 2021-11-05 DIAGNOSIS — H401113 Primary open-angle glaucoma, right eye, severe stage: Secondary | ICD-10-CM | POA: Diagnosis not present

## 2021-11-05 DIAGNOSIS — Z961 Presence of intraocular lens: Secondary | ICD-10-CM | POA: Diagnosis not present

## 2021-11-05 DIAGNOSIS — H40012 Open angle with borderline findings, low risk, left eye: Secondary | ICD-10-CM | POA: Diagnosis not present

## 2021-11-05 DIAGNOSIS — H353231 Exudative age-related macular degeneration, bilateral, with active choroidal neovascularization: Secondary | ICD-10-CM | POA: Diagnosis not present

## 2021-11-12 DIAGNOSIS — G459 Transient cerebral ischemic attack, unspecified: Secondary | ICD-10-CM | POA: Diagnosis not present

## 2021-11-14 ENCOUNTER — Encounter (INDEPENDENT_AMBULATORY_CARE_PROVIDER_SITE_OTHER): Payer: Medicare HMO | Admitting: Ophthalmology

## 2021-11-14 ENCOUNTER — Other Ambulatory Visit: Payer: Self-pay

## 2021-11-14 DIAGNOSIS — H353221 Exudative age-related macular degeneration, left eye, with active choroidal neovascularization: Secondary | ICD-10-CM

## 2021-11-14 DIAGNOSIS — H43812 Vitreous degeneration, left eye: Secondary | ICD-10-CM | POA: Diagnosis not present

## 2021-12-12 DIAGNOSIS — I517 Cardiomegaly: Secondary | ICD-10-CM | POA: Diagnosis not present

## 2021-12-12 DIAGNOSIS — U071 COVID-19: Secondary | ICD-10-CM | POA: Diagnosis not present

## 2021-12-12 DIAGNOSIS — J449 Chronic obstructive pulmonary disease, unspecified: Secondary | ICD-10-CM | POA: Diagnosis not present

## 2021-12-12 DIAGNOSIS — Z743 Need for continuous supervision: Secondary | ICD-10-CM | POA: Diagnosis not present

## 2021-12-12 DIAGNOSIS — I5031 Acute diastolic (congestive) heart failure: Secondary | ICD-10-CM | POA: Diagnosis not present

## 2021-12-12 DIAGNOSIS — I110 Hypertensive heart disease with heart failure: Secondary | ICD-10-CM | POA: Diagnosis not present

## 2021-12-12 DIAGNOSIS — R110 Nausea: Secondary | ICD-10-CM | POA: Diagnosis not present

## 2021-12-12 DIAGNOSIS — N410 Acute prostatitis: Secondary | ICD-10-CM | POA: Diagnosis not present

## 2021-12-12 DIAGNOSIS — E7800 Pure hypercholesterolemia, unspecified: Secondary | ICD-10-CM | POA: Diagnosis not present

## 2021-12-12 DIAGNOSIS — I130 Hypertensive heart and chronic kidney disease with heart failure and stage 1 through stage 4 chronic kidney disease, or unspecified chronic kidney disease: Secondary | ICD-10-CM | POA: Diagnosis not present

## 2021-12-12 DIAGNOSIS — I509 Heart failure, unspecified: Secondary | ICD-10-CM | POA: Diagnosis not present

## 2021-12-12 DIAGNOSIS — E871 Hypo-osmolality and hyponatremia: Secondary | ICD-10-CM | POA: Diagnosis not present

## 2021-12-12 DIAGNOSIS — Z9049 Acquired absence of other specified parts of digestive tract: Secondary | ICD-10-CM | POA: Diagnosis not present

## 2021-12-12 DIAGNOSIS — E876 Hypokalemia: Secondary | ICD-10-CM | POA: Diagnosis not present

## 2021-12-12 DIAGNOSIS — M545 Low back pain, unspecified: Secondary | ICD-10-CM | POA: Diagnosis not present

## 2021-12-12 DIAGNOSIS — Z79899 Other long term (current) drug therapy: Secondary | ICD-10-CM | POA: Diagnosis not present

## 2021-12-12 DIAGNOSIS — N400 Enlarged prostate without lower urinary tract symptoms: Secondary | ICD-10-CM | POA: Diagnosis not present

## 2021-12-12 DIAGNOSIS — I083 Combined rheumatic disorders of mitral, aortic and tricuspid valves: Secondary | ICD-10-CM | POA: Diagnosis not present

## 2021-12-12 DIAGNOSIS — R531 Weakness: Secondary | ICD-10-CM | POA: Diagnosis not present

## 2021-12-12 DIAGNOSIS — Z880 Allergy status to penicillin: Secondary | ICD-10-CM | POA: Diagnosis not present

## 2021-12-12 DIAGNOSIS — J811 Chronic pulmonary edema: Secondary | ICD-10-CM | POA: Diagnosis not present

## 2021-12-12 DIAGNOSIS — N390 Urinary tract infection, site not specified: Secondary | ICD-10-CM | POA: Diagnosis not present

## 2021-12-12 DIAGNOSIS — N189 Chronic kidney disease, unspecified: Secondary | ICD-10-CM | POA: Diagnosis not present

## 2021-12-12 DIAGNOSIS — M4316 Spondylolisthesis, lumbar region: Secondary | ICD-10-CM | POA: Diagnosis not present

## 2021-12-12 DIAGNOSIS — Z792 Long term (current) use of antibiotics: Secondary | ICD-10-CM | POA: Diagnosis not present

## 2021-12-12 DIAGNOSIS — I5033 Acute on chronic diastolic (congestive) heart failure: Secondary | ICD-10-CM | POA: Diagnosis not present

## 2021-12-12 DIAGNOSIS — I088 Other rheumatic multiple valve diseases: Secondary | ICD-10-CM | POA: Diagnosis not present

## 2021-12-13 DIAGNOSIS — I509 Heart failure, unspecified: Secondary | ICD-10-CM | POA: Diagnosis not present

## 2021-12-13 DIAGNOSIS — I083 Combined rheumatic disorders of mitral, aortic and tricuspid valves: Secondary | ICD-10-CM | POA: Diagnosis not present

## 2021-12-14 DIAGNOSIS — Z792 Long term (current) use of antibiotics: Secondary | ICD-10-CM | POA: Diagnosis not present

## 2021-12-14 DIAGNOSIS — U071 COVID-19: Secondary | ICD-10-CM | POA: Diagnosis not present

## 2021-12-14 DIAGNOSIS — J449 Chronic obstructive pulmonary disease, unspecified: Secondary | ICD-10-CM | POA: Diagnosis not present

## 2021-12-14 DIAGNOSIS — I110 Hypertensive heart disease with heart failure: Secondary | ICD-10-CM | POA: Diagnosis not present

## 2021-12-14 DIAGNOSIS — I5031 Acute diastolic (congestive) heart failure: Secondary | ICD-10-CM | POA: Diagnosis not present

## 2021-12-14 DIAGNOSIS — Z79899 Other long term (current) drug therapy: Secondary | ICD-10-CM | POA: Diagnosis not present

## 2021-12-14 DIAGNOSIS — N390 Urinary tract infection, site not specified: Secondary | ICD-10-CM | POA: Diagnosis not present

## 2021-12-14 DIAGNOSIS — E871 Hypo-osmolality and hyponatremia: Secondary | ICD-10-CM | POA: Diagnosis not present

## 2021-12-15 DIAGNOSIS — U071 COVID-19: Secondary | ICD-10-CM | POA: Diagnosis not present

## 2021-12-15 DIAGNOSIS — Z79899 Other long term (current) drug therapy: Secondary | ICD-10-CM | POA: Diagnosis not present

## 2021-12-15 DIAGNOSIS — E871 Hypo-osmolality and hyponatremia: Secondary | ICD-10-CM | POA: Diagnosis not present

## 2021-12-15 DIAGNOSIS — N390 Urinary tract infection, site not specified: Secondary | ICD-10-CM | POA: Diagnosis not present

## 2021-12-15 DIAGNOSIS — J449 Chronic obstructive pulmonary disease, unspecified: Secondary | ICD-10-CM | POA: Diagnosis not present

## 2021-12-15 DIAGNOSIS — Z792 Long term (current) use of antibiotics: Secondary | ICD-10-CM | POA: Diagnosis not present

## 2021-12-15 DIAGNOSIS — I5031 Acute diastolic (congestive) heart failure: Secondary | ICD-10-CM | POA: Diagnosis not present

## 2021-12-15 DIAGNOSIS — I110 Hypertensive heart disease with heart failure: Secondary | ICD-10-CM | POA: Diagnosis not present

## 2021-12-16 DIAGNOSIS — J449 Chronic obstructive pulmonary disease, unspecified: Secondary | ICD-10-CM | POA: Diagnosis not present

## 2021-12-16 DIAGNOSIS — I110 Hypertensive heart disease with heart failure: Secondary | ICD-10-CM | POA: Diagnosis not present

## 2021-12-16 DIAGNOSIS — I5031 Acute diastolic (congestive) heart failure: Secondary | ICD-10-CM | POA: Diagnosis not present

## 2021-12-16 DIAGNOSIS — N390 Urinary tract infection, site not specified: Secondary | ICD-10-CM | POA: Diagnosis not present

## 2021-12-16 DIAGNOSIS — Z792 Long term (current) use of antibiotics: Secondary | ICD-10-CM | POA: Diagnosis not present

## 2021-12-16 DIAGNOSIS — Z79899 Other long term (current) drug therapy: Secondary | ICD-10-CM | POA: Diagnosis not present

## 2021-12-16 DIAGNOSIS — E871 Hypo-osmolality and hyponatremia: Secondary | ICD-10-CM | POA: Diagnosis not present

## 2021-12-16 DIAGNOSIS — U071 COVID-19: Secondary | ICD-10-CM | POA: Diagnosis not present

## 2021-12-22 DIAGNOSIS — J811 Chronic pulmonary edema: Secondary | ICD-10-CM | POA: Diagnosis not present

## 2021-12-22 DIAGNOSIS — U071 COVID-19: Secondary | ICD-10-CM | POA: Diagnosis not present

## 2021-12-22 DIAGNOSIS — R531 Weakness: Secondary | ICD-10-CM | POA: Diagnosis not present

## 2021-12-22 DIAGNOSIS — I509 Heart failure, unspecified: Secondary | ICD-10-CM | POA: Diagnosis not present

## 2021-12-22 DIAGNOSIS — Z79899 Other long term (current) drug therapy: Secondary | ICD-10-CM | POA: Diagnosis not present

## 2021-12-22 DIAGNOSIS — E871 Hypo-osmolality and hyponatremia: Secondary | ICD-10-CM | POA: Diagnosis not present

## 2021-12-22 DIAGNOSIS — R110 Nausea: Secondary | ICD-10-CM | POA: Diagnosis not present

## 2021-12-22 DIAGNOSIS — N400 Enlarged prostate without lower urinary tract symptoms: Secondary | ICD-10-CM | POA: Diagnosis not present

## 2021-12-23 DIAGNOSIS — N400 Enlarged prostate without lower urinary tract symptoms: Secondary | ICD-10-CM | POA: Diagnosis not present

## 2021-12-23 DIAGNOSIS — J811 Chronic pulmonary edema: Secondary | ICD-10-CM | POA: Diagnosis not present

## 2021-12-23 DIAGNOSIS — I509 Heart failure, unspecified: Secondary | ICD-10-CM | POA: Diagnosis not present

## 2021-12-23 DIAGNOSIS — U071 COVID-19: Secondary | ICD-10-CM | POA: Diagnosis not present

## 2021-12-23 DIAGNOSIS — E876 Hypokalemia: Secondary | ICD-10-CM | POA: Diagnosis not present

## 2021-12-23 DIAGNOSIS — E871 Hypo-osmolality and hyponatremia: Secondary | ICD-10-CM | POA: Diagnosis not present

## 2021-12-23 DIAGNOSIS — Z79899 Other long term (current) drug therapy: Secondary | ICD-10-CM | POA: Diagnosis not present

## 2021-12-24 DIAGNOSIS — R2689 Other abnormalities of gait and mobility: Secondary | ICD-10-CM | POA: Diagnosis not present

## 2021-12-24 DIAGNOSIS — I10 Essential (primary) hypertension: Secondary | ICD-10-CM | POA: Diagnosis not present

## 2021-12-24 DIAGNOSIS — R531 Weakness: Secondary | ICD-10-CM | POA: Diagnosis not present

## 2021-12-24 DIAGNOSIS — E871 Hypo-osmolality and hyponatremia: Secondary | ICD-10-CM | POA: Diagnosis not present

## 2021-12-24 DIAGNOSIS — I509 Heart failure, unspecified: Secondary | ICD-10-CM | POA: Diagnosis not present

## 2021-12-24 DIAGNOSIS — H409 Unspecified glaucoma: Secondary | ICD-10-CM | POA: Diagnosis not present

## 2021-12-24 DIAGNOSIS — M6281 Muscle weakness (generalized): Secondary | ICD-10-CM | POA: Diagnosis not present

## 2021-12-24 DIAGNOSIS — R41841 Cognitive communication deficit: Secondary | ICD-10-CM | POA: Diagnosis not present

## 2021-12-24 DIAGNOSIS — Z950 Presence of cardiac pacemaker: Secondary | ICD-10-CM | POA: Diagnosis not present

## 2021-12-24 DIAGNOSIS — U071 COVID-19: Secondary | ICD-10-CM | POA: Diagnosis not present

## 2021-12-24 DIAGNOSIS — I5030 Unspecified diastolic (congestive) heart failure: Secondary | ICD-10-CM | POA: Diagnosis not present

## 2021-12-24 DIAGNOSIS — N390 Urinary tract infection, site not specified: Secondary | ICD-10-CM | POA: Diagnosis not present

## 2021-12-24 DIAGNOSIS — R600 Localized edema: Secondary | ICD-10-CM | POA: Diagnosis not present

## 2021-12-24 DIAGNOSIS — Z299 Encounter for prophylactic measures, unspecified: Secondary | ICD-10-CM | POA: Diagnosis not present

## 2021-12-24 DIAGNOSIS — J449 Chronic obstructive pulmonary disease, unspecified: Secondary | ICD-10-CM | POA: Diagnosis not present

## 2021-12-24 DIAGNOSIS — Z8616 Personal history of COVID-19: Secondary | ICD-10-CM | POA: Diagnosis not present

## 2021-12-24 DIAGNOSIS — I453 Trifascicular block: Secondary | ICD-10-CM | POA: Diagnosis not present

## 2021-12-25 DIAGNOSIS — N390 Urinary tract infection, site not specified: Secondary | ICD-10-CM | POA: Diagnosis not present

## 2021-12-25 DIAGNOSIS — Z950 Presence of cardiac pacemaker: Secondary | ICD-10-CM | POA: Diagnosis not present

## 2021-12-25 DIAGNOSIS — R531 Weakness: Secondary | ICD-10-CM | POA: Diagnosis not present

## 2021-12-25 DIAGNOSIS — Z8616 Personal history of COVID-19: Secondary | ICD-10-CM | POA: Diagnosis not present

## 2022-01-03 DIAGNOSIS — Z299 Encounter for prophylactic measures, unspecified: Secondary | ICD-10-CM | POA: Diagnosis not present

## 2022-01-03 DIAGNOSIS — Z950 Presence of cardiac pacemaker: Secondary | ICD-10-CM | POA: Diagnosis not present

## 2022-01-03 DIAGNOSIS — R600 Localized edema: Secondary | ICD-10-CM | POA: Diagnosis not present

## 2022-01-03 DIAGNOSIS — H409 Unspecified glaucoma: Secondary | ICD-10-CM | POA: Diagnosis not present

## 2022-01-03 DIAGNOSIS — I453 Trifascicular block: Secondary | ICD-10-CM | POA: Diagnosis not present

## 2022-01-14 DIAGNOSIS — I739 Peripheral vascular disease, unspecified: Secondary | ICD-10-CM | POA: Diagnosis not present

## 2022-01-14 DIAGNOSIS — M6281 Muscle weakness (generalized): Secondary | ICD-10-CM | POA: Diagnosis not present

## 2022-01-14 DIAGNOSIS — Z299 Encounter for prophylactic measures, unspecified: Secondary | ICD-10-CM | POA: Diagnosis not present

## 2022-01-14 DIAGNOSIS — R2689 Other abnormalities of gait and mobility: Secondary | ICD-10-CM | POA: Diagnosis not present

## 2022-01-14 DIAGNOSIS — N390 Urinary tract infection, site not specified: Secondary | ICD-10-CM | POA: Diagnosis not present

## 2022-01-14 DIAGNOSIS — I10 Essential (primary) hypertension: Secondary | ICD-10-CM | POA: Diagnosis not present

## 2022-01-14 DIAGNOSIS — J449 Chronic obstructive pulmonary disease, unspecified: Secondary | ICD-10-CM | POA: Diagnosis not present

## 2022-01-14 DIAGNOSIS — Z8616 Personal history of COVID-19: Secondary | ICD-10-CM | POA: Diagnosis not present

## 2022-01-14 DIAGNOSIS — I5030 Unspecified diastolic (congestive) heart failure: Secondary | ICD-10-CM | POA: Diagnosis not present

## 2022-01-14 DIAGNOSIS — K219 Gastro-esophageal reflux disease without esophagitis: Secondary | ICD-10-CM | POA: Diagnosis not present

## 2022-01-14 DIAGNOSIS — E871 Hypo-osmolality and hyponatremia: Secondary | ICD-10-CM | POA: Diagnosis not present

## 2022-01-14 DIAGNOSIS — R41841 Cognitive communication deficit: Secondary | ICD-10-CM | POA: Diagnosis not present

## 2022-01-14 DIAGNOSIS — C679 Malignant neoplasm of bladder, unspecified: Secondary | ICD-10-CM | POA: Diagnosis not present

## 2022-01-14 DIAGNOSIS — N183 Chronic kidney disease, stage 3 unspecified: Secondary | ICD-10-CM | POA: Diagnosis not present

## 2022-01-14 DIAGNOSIS — I509 Heart failure, unspecified: Secondary | ICD-10-CM | POA: Diagnosis not present

## 2022-01-14 DIAGNOSIS — I495 Sick sinus syndrome: Secondary | ICD-10-CM | POA: Diagnosis not present

## 2022-01-14 DIAGNOSIS — H353290 Exudative age-related macular degeneration, unspecified eye, stage unspecified: Secondary | ICD-10-CM | POA: Diagnosis not present

## 2022-01-29 DIAGNOSIS — J449 Chronic obstructive pulmonary disease, unspecified: Secondary | ICD-10-CM | POA: Diagnosis not present

## 2022-01-29 DIAGNOSIS — Z299 Encounter for prophylactic measures, unspecified: Secondary | ICD-10-CM | POA: Diagnosis not present

## 2022-01-29 DIAGNOSIS — I739 Peripheral vascular disease, unspecified: Secondary | ICD-10-CM | POA: Diagnosis not present

## 2022-01-29 DIAGNOSIS — I509 Heart failure, unspecified: Secondary | ICD-10-CM | POA: Diagnosis not present

## 2022-01-29 DIAGNOSIS — N183 Chronic kidney disease, stage 3 unspecified: Secondary | ICD-10-CM | POA: Diagnosis not present

## 2022-01-31 DIAGNOSIS — C679 Malignant neoplasm of bladder, unspecified: Secondary | ICD-10-CM | POA: Diagnosis not present

## 2022-01-31 DIAGNOSIS — I495 Sick sinus syndrome: Secondary | ICD-10-CM | POA: Diagnosis not present

## 2022-01-31 DIAGNOSIS — Z299 Encounter for prophylactic measures, unspecified: Secondary | ICD-10-CM | POA: Diagnosis not present

## 2022-01-31 DIAGNOSIS — H353290 Exudative age-related macular degeneration, unspecified eye, stage unspecified: Secondary | ICD-10-CM | POA: Diagnosis not present

## 2022-01-31 DIAGNOSIS — K219 Gastro-esophageal reflux disease without esophagitis: Secondary | ICD-10-CM | POA: Diagnosis not present

## 2022-02-05 DIAGNOSIS — M6281 Muscle weakness (generalized): Secondary | ICD-10-CM | POA: Diagnosis not present

## 2022-02-05 DIAGNOSIS — I10 Essential (primary) hypertension: Secondary | ICD-10-CM | POA: Diagnosis not present

## 2022-02-05 DIAGNOSIS — N400 Enlarged prostate without lower urinary tract symptoms: Secondary | ICD-10-CM | POA: Diagnosis not present

## 2022-02-05 DIAGNOSIS — K219 Gastro-esophageal reflux disease without esophagitis: Secondary | ICD-10-CM | POA: Diagnosis not present

## 2022-02-08 ENCOUNTER — Telehealth: Payer: Self-pay

## 2022-02-08 NOTE — Telephone Encounter (Signed)
Patient daughter called in stating she is moving her father to [place] and he will find a cardiologist there. She will bring the monitor and plug it up once she gets there. She will let us monitor him until she finds him a new doc

## 2022-02-12 DIAGNOSIS — M79675 Pain in left toe(s): Secondary | ICD-10-CM | POA: Diagnosis not present

## 2022-02-12 DIAGNOSIS — M79674 Pain in right toe(s): Secondary | ICD-10-CM | POA: Diagnosis not present

## 2022-02-12 DIAGNOSIS — B351 Tinea unguium: Secondary | ICD-10-CM | POA: Diagnosis not present

## 2022-02-12 DIAGNOSIS — L600 Ingrowing nail: Secondary | ICD-10-CM | POA: Diagnosis not present

## 2022-02-20 ENCOUNTER — Encounter (INDEPENDENT_AMBULATORY_CARE_PROVIDER_SITE_OTHER): Payer: Medicare HMO | Admitting: Ophthalmology

## 2022-02-25 ENCOUNTER — Ambulatory Visit (INDEPENDENT_AMBULATORY_CARE_PROVIDER_SITE_OTHER): Payer: Medicare HMO

## 2022-02-25 DIAGNOSIS — I495 Sick sinus syndrome: Secondary | ICD-10-CM

## 2022-02-28 LAB — CUP PACEART REMOTE DEVICE CHECK
Battery Remaining Longevity: 72 mo
Battery Remaining Percentage: 66 %
Battery Voltage: 2.99 V
Brady Statistic AP VP Percent: 27 %
Brady Statistic AP VS Percent: 57 %
Brady Statistic AS VP Percent: 1 %
Brady Statistic AS VS Percent: 14 %
Brady Statistic RA Percent Paced: 83 %
Brady Statistic RV Percent Paced: 28 %
Date Time Interrogation Session: 20230618145747
Implantable Lead Implant Date: 20190730
Implantable Lead Implant Date: 20190730
Implantable Lead Location: 753859
Implantable Lead Location: 753860
Implantable Pulse Generator Implant Date: 20190730
Lead Channel Impedance Value: 430 Ohm
Lead Channel Impedance Value: 440 Ohm
Lead Channel Pacing Threshold Amplitude: 1.25 V
Lead Channel Pacing Threshold Amplitude: 1.25 V
Lead Channel Pacing Threshold Pulse Width: 0.5 ms
Lead Channel Pacing Threshold Pulse Width: 0.5 ms
Lead Channel Sensing Intrinsic Amplitude: 12 mV
Lead Channel Sensing Intrinsic Amplitude: 3.9 mV
Lead Channel Setting Pacing Amplitude: 1.5 V
Lead Channel Setting Pacing Amplitude: 2.25 V
Lead Channel Setting Pacing Pulse Width: 0.5 ms
Lead Channel Setting Sensing Sensitivity: 2 mV
Pulse Gen Model: 2272
Pulse Gen Serial Number: 9049493

## 2022-03-14 NOTE — Progress Notes (Signed)
Remote pacemaker transmission.   

## 2022-04-12 ENCOUNTER — Telehealth: Payer: Medicare HMO | Admitting: Internal Medicine

## 2022-04-12 NOTE — Telephone Encounter (Signed)
Jonathon Butler from [place] [facility] requesting a change from [facility] to them.

## 2022-04-12 NOTE — Telephone Encounter (Signed)
I have released the patient to the [facility]. I canceled all upcoming remotes, and marked patient in [place].

## 2022-09-13 ENCOUNTER — Encounter: Payer: Medicare HMO | Admitting: Internal Medicine

## 2022-09-27 ENCOUNTER — Encounter: Payer: Medicare HMO | Admitting: Cardiovascular Disease
